# Patient Record
Sex: Female | Born: 1970
Health system: Southern US, Community
[De-identification: ages and names within clinical notes are randomized; demographics above are authoritative.]

## PROBLEM LIST (undated history)

## (undated) DIAGNOSIS — E559 Vitamin D deficiency, unspecified: Secondary | ICD-10-CM

## (undated) DIAGNOSIS — R768 Other specified abnormal immunological findings in serum: Secondary | ICD-10-CM

## (undated) DIAGNOSIS — D649 Anemia, unspecified: Secondary | ICD-10-CM

## (undated) DIAGNOSIS — K219 Gastro-esophageal reflux disease without esophagitis: Secondary | ICD-10-CM

## (undated) DIAGNOSIS — K4090 Unilateral inguinal hernia, without obstruction or gangrene, not specified as recurrent: Secondary | ICD-10-CM

## (undated) HISTORY — PX: LAMINECTOMY: SHX219

## (undated) HISTORY — PX: BACK SURGERY: SHX140

## (undated) HISTORY — PX: HERNIA REPAIR: SHX51

---

## 2007-02-14 ENCOUNTER — Emergency Department (HOSPITAL_COMMUNITY): Admission: EM | Admit: 2007-02-14 | Discharge: 2007-02-14 | Payer: Self-pay | Admitting: Emergency Medicine
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
## 2011-11-21 ENCOUNTER — Other Ambulatory Visit (HOSPITAL_COMMUNITY)
Admission: RE | Admit: 2011-11-21 | Discharge: 2011-11-21 | Disposition: A | Discharge status: Home / Self Care | Payer: BC Managed Care – PPO | Source: Ambulatory Visit | Attending: Family Medicine | Admitting: Family Medicine

## 2011-11-21 ENCOUNTER — Other Ambulatory Visit: Payer: Self-pay | Admitting: Family Medicine

## 2011-11-21 DIAGNOSIS — Z01419 Encounter for gynecological examination (general) (routine) without abnormal findings: Secondary | ICD-10-CM | POA: Insufficient documentation

## 2011-11-21 DIAGNOSIS — Z1231 Encounter for screening mammogram for malignant neoplasm of breast: Secondary | ICD-10-CM

## 2011-11-26 ENCOUNTER — Ambulatory Visit
Admission: RE | Admit: 2011-11-26 | Discharge: 2011-11-26 | Disposition: A | Discharge status: Home / Self Care | Payer: BC Managed Care – PPO | Source: Ambulatory Visit | Attending: Family Medicine | Admitting: Family Medicine

## 2011-11-26 DIAGNOSIS — Z1231 Encounter for screening mammogram for malignant neoplasm of breast: Secondary | ICD-10-CM

## 2011-11-28 ENCOUNTER — Other Ambulatory Visit: Payer: Self-pay | Admitting: Family Medicine

## 2011-11-28 DIAGNOSIS — R928 Other abnormal and inconclusive findings on diagnostic imaging of breast: Secondary | ICD-10-CM

## 2011-12-05 ENCOUNTER — Ambulatory Visit
Admission: RE | Admit: 2011-12-05 | Discharge: 2011-12-05 | Disposition: A | Discharge status: Home / Self Care | Payer: BC Managed Care – PPO | Source: Ambulatory Visit | Attending: Family Medicine | Admitting: Family Medicine

## 2011-12-05 DIAGNOSIS — R928 Other abnormal and inconclusive findings on diagnostic imaging of breast: Secondary | ICD-10-CM

## 2012-03-25 ENCOUNTER — Ambulatory Visit: Payer: BC Managed Care – PPO | Admitting: Gastroenterology

## 2012-08-10 ENCOUNTER — Other Ambulatory Visit (HOSPITAL_COMMUNITY): Payer: Self-pay | Admitting: Obstetrics and Gynecology

## 2012-08-10 DIAGNOSIS — N979 Female infertility, unspecified: Secondary | ICD-10-CM

## 2012-08-11 ENCOUNTER — Ambulatory Visit (HOSPITAL_COMMUNITY)
Admission: RE | Admit: 2012-08-11 | Discharge: 2012-08-11 | Disposition: A | Discharge status: Home / Self Care | Payer: 59 | Source: Ambulatory Visit | Attending: Obstetrics and Gynecology | Admitting: Obstetrics and Gynecology

## 2012-08-11 DIAGNOSIS — N979 Female infertility, unspecified: Secondary | ICD-10-CM | POA: Insufficient documentation

## 2012-08-11 MED ORDER — IOHEXOL 300 MG/ML  SOLN
10.0000 mL | Freq: Once | INTRAMUSCULAR | Status: AC | PRN
  Administered 2012-08-11: 10 mL

## 2012-10-05 ENCOUNTER — Other Ambulatory Visit: Payer: Self-pay | Admitting: Family Medicine

## 2012-10-05 DIAGNOSIS — R52 Pain, unspecified: Secondary | ICD-10-CM

## 2012-10-05 DIAGNOSIS — M79673 Pain in unspecified foot: Secondary | ICD-10-CM

## 2012-10-06 ENCOUNTER — Ambulatory Visit (HOSPITAL_COMMUNITY)
Admission: RE | Admit: 2012-10-06 | Discharge: 2012-10-06 | Disposition: A | Discharge status: Home / Self Care | Payer: 59 | Source: Ambulatory Visit | Attending: Family Medicine | Admitting: Family Medicine

## 2012-10-06 DIAGNOSIS — M79673 Pain in unspecified foot: Secondary | ICD-10-CM

## 2012-10-06 DIAGNOSIS — M79609 Pain in unspecified limb: Secondary | ICD-10-CM | POA: Insufficient documentation

## 2012-10-06 DIAGNOSIS — M65979 Unspecified synovitis and tenosynovitis, unspecified ankle and foot: Secondary | ICD-10-CM | POA: Insufficient documentation

## 2012-10-06 DIAGNOSIS — M19079 Primary osteoarthritis, unspecified ankle and foot: Secondary | ICD-10-CM | POA: Insufficient documentation

## 2012-10-06 DIAGNOSIS — M722 Plantar fascial fibromatosis: Secondary | ICD-10-CM | POA: Insufficient documentation

## 2012-10-06 DIAGNOSIS — R52 Pain, unspecified: Secondary | ICD-10-CM

## 2012-10-06 DIAGNOSIS — M659 Synovitis and tenosynovitis, unspecified: Secondary | ICD-10-CM | POA: Insufficient documentation

## 2012-10-06 DIAGNOSIS — R6889 Other general symptoms and signs: Secondary | ICD-10-CM | POA: Insufficient documentation

## 2012-11-17 ENCOUNTER — Other Ambulatory Visit: Payer: Self-pay | Admitting: Family Medicine

## 2012-11-17 DIAGNOSIS — Z1231 Encounter for screening mammogram for malignant neoplasm of breast: Secondary | ICD-10-CM

## 2012-12-09 ENCOUNTER — Other Ambulatory Visit: Payer: Self-pay | Admitting: Family Medicine

## 2012-12-09 ENCOUNTER — Ambulatory Visit
Admission: RE | Admit: 2012-12-09 | Discharge: 2012-12-09 | Disposition: A | Discharge status: Home / Self Care | Payer: BC Managed Care – PPO | Source: Ambulatory Visit | Attending: Family Medicine | Admitting: Family Medicine

## 2012-12-09 DIAGNOSIS — Z1231 Encounter for screening mammogram for malignant neoplasm of breast: Secondary | ICD-10-CM

## 2012-12-09 DIAGNOSIS — R928 Other abnormal and inconclusive findings on diagnostic imaging of breast: Secondary | ICD-10-CM

## 2012-12-23 ENCOUNTER — Ambulatory Visit
Admission: RE | Admit: 2012-12-23 | Discharge: 2012-12-23 | Disposition: A | Discharge status: Home / Self Care | Payer: BC Managed Care – PPO | Source: Ambulatory Visit | Attending: Family Medicine | Admitting: Family Medicine

## 2012-12-23 ENCOUNTER — Other Ambulatory Visit: Payer: Self-pay | Admitting: Family Medicine

## 2012-12-23 DIAGNOSIS — R928 Other abnormal and inconclusive findings on diagnostic imaging of breast: Secondary | ICD-10-CM

## 2013-02-28 ENCOUNTER — Other Ambulatory Visit (HOSPITAL_COMMUNITY): Payer: Self-pay | Admitting: Podiatry

## 2013-02-28 DIAGNOSIS — M79672 Pain in left foot: Secondary | ICD-10-CM

## 2013-02-28 DIAGNOSIS — M722 Plantar fascial fibromatosis: Secondary | ICD-10-CM

## 2013-06-07 ENCOUNTER — Ambulatory Visit: Payer: BC Managed Care – PPO | Attending: Family Medicine

## 2013-12-19 ENCOUNTER — Other Ambulatory Visit: Payer: Self-pay

## 2013-12-19 DIAGNOSIS — Z1231 Encounter for screening mammogram for malignant neoplasm of breast: Secondary | ICD-10-CM

## 2014-01-04 ENCOUNTER — Ambulatory Visit
Admission: RE | Admit: 2014-01-04 | Discharge: 2014-01-04 | Disposition: A | Discharge status: Home / Self Care | Payer: No Typology Code available for payment source | Source: Ambulatory Visit

## 2014-01-04 DIAGNOSIS — Z1231 Encounter for screening mammogram for malignant neoplasm of breast: Secondary | ICD-10-CM

## 2014-03-06 ENCOUNTER — Ambulatory Visit: Payer: Self-pay | Admitting: Medical

## 2014-04-15 ENCOUNTER — Emergency Department (INDEPENDENT_AMBULATORY_CARE_PROVIDER_SITE_OTHER): Payer: Worker's Compensation

## 2014-04-15 ENCOUNTER — Encounter (HOSPITAL_COMMUNITY): Payer: Self-pay | Admitting: Emergency Medicine

## 2014-04-15 ENCOUNTER — Emergency Department (INDEPENDENT_AMBULATORY_CARE_PROVIDER_SITE_OTHER)
Admission: EM | Admit: 2014-04-15 | Discharge: 2014-04-15 | Disposition: A | Discharge status: Home / Self Care | Payer: Worker's Compensation | Source: Home / Self Care | Attending: Emergency Medicine | Admitting: Emergency Medicine

## 2014-04-15 DIAGNOSIS — S60221A Contusion of right hand, initial encounter: Secondary | ICD-10-CM

## 2014-04-15 DIAGNOSIS — S60229A Contusion of unspecified hand, initial encounter: Secondary | ICD-10-CM

## 2014-04-15 DIAGNOSIS — Y9229 Other specified public building as the place of occurrence of the external cause: Secondary | ICD-10-CM

## 2014-04-15 DIAGNOSIS — W230XXA Caught, crushed, jammed, or pinched between moving objects, initial encounter: Secondary | ICD-10-CM

## 2014-04-15 NOTE — Discharge Instructions (Signed)

## 2014-04-15 NOTE — ED Provider Notes (Signed)
  Chief Complaint   Chief Complaint  Patient presents with  . Hand Pain    History of Present Illness   Kristin Conner is a 43 year old female who closed her right hand in the foldable folding door around 6:15 PM tonight at work at [employer]. She has some bruises over the mid phalanx of the second, third, and fourth fingers. She's able to fully extend and flex the fingers and there is no numbness or tingling. This is a workers comp case.  Review of Systems   Other than as noted above, the patient denies any of the following symptoms: Systemic:  No fevers or chills. Musculoskeletal:  No joint pain or arthritis.  Neurological:  No muscular weakness or paresthesias.  [place]   Past medical history, family history, social history, meds, and allergies were reviewed.     Physical Examination   Vital signs:  BP 113/76  Temp(Src) 98.7 F (37.1 C) (Oral)  Resp 16  SpO2 100%  LMP 03/25/2014 Gen:  Alert and oriented times 3.  In no distress. Musculoskeletal:  Exam of the hand reveals bruising and pain to palpation over the mid phalanges of the second, third, and fourth fingers dorsally. She has full extension full flexion.  Otherwise, all joints had a full a ROM with no swelling, bruising or deformity.  No edema, pulses full. Extremities were warm and pink.  Capillary refill was brisk.  Skin:  Clear, warm and dry.  No rash. Neuro:  Alert and oriented times 3.  Muscle strength was normal.  Sensation was intact to light touch.   Radiology   Dg Hand Complete Right  04/15/2014   CLINICAL DATA:  Hand slammed in a door. Pain in the 2nd to 4th digits.  EXAM: RIGHT HAND - COMPLETE 3+ VIEW  COMPARISON:  Mild soft tissue swelling is present.  None available.  FINDINGS: Mild soft tissue swelling is present. No acute osseous abnormality is present. No radiopaque foreign body is evident.  IMPRESSION: Soft tissue swelling without acute osseous abnormality.   Electronically Signed   By: Lawrence Santiago M.D.   On: 04/15/2014 19:44   I reviewed the images independently and personally and concur with the radiologist's findings.  Assessment   The encounter diagnosis was Contusion of right hand.  Advised rest, ice, elevation.  Plan   1.  Meds:  The following meds were prescribed:  There are no discharge medications for this patient.   2.  Patient Education/Counseling:  The patient was given appropriate handouts, self care instructions, and instructed in symptomatic relief, including rest and activity, and elevation.   3.  Follow up:  The patient was told to follow up here if no better in 3 to 4 days, or sooner if becoming worse in any way, and given some red flag symptoms such as worsening pain, fever, swelling, or neurological symptoms which would prompt immediate return.  Followup with occupational health clinic if no improvement by next week.      Harden Mo, MD 04/15/14 2138

## 2014-04-15 NOTE — ED Notes (Signed)
Patient states she had gotten her right hand caught in a retractable Door injuring  Three fingers on her right hand

## 2014-12-11 ENCOUNTER — Other Ambulatory Visit: Payer: Self-pay

## 2014-12-11 DIAGNOSIS — Z1231 Encounter for screening mammogram for malignant neoplasm of breast: Secondary | ICD-10-CM

## 2015-01-08 ENCOUNTER — Ambulatory Visit
Admission: RE | Admit: 2015-01-08 | Discharge: 2015-01-08 | Disposition: A | Discharge status: Home / Self Care | Payer: BLUE CROSS/BLUE SHIELD | Source: Ambulatory Visit

## 2015-01-08 DIAGNOSIS — Z1231 Encounter for screening mammogram for malignant neoplasm of breast: Secondary | ICD-10-CM

## 2015-11-16 ENCOUNTER — Other Ambulatory Visit: Payer: Self-pay | Admitting: Family Medicine

## 2015-11-16 DIAGNOSIS — N920 Excessive and frequent menstruation with regular cycle: Secondary | ICD-10-CM

## 2015-11-16 DIAGNOSIS — Z1231 Encounter for screening mammogram for malignant neoplasm of breast: Secondary | ICD-10-CM

## 2015-11-27 ENCOUNTER — Ambulatory Visit
Admission: RE | Admit: 2015-11-27 | Discharge: 2015-11-27 | Disposition: A | Discharge status: Home / Self Care | Payer: BLUE CROSS/BLUE SHIELD | Source: Ambulatory Visit | Attending: Family Medicine | Admitting: Family Medicine

## 2015-11-27 DIAGNOSIS — N920 Excessive and frequent menstruation with regular cycle: Secondary | ICD-10-CM

## 2016-01-09 ENCOUNTER — Ambulatory Visit
Admission: RE | Admit: 2016-01-09 | Discharge: 2016-01-09 | Disposition: A | Discharge status: Home / Self Care | Payer: BLUE CROSS/BLUE SHIELD | Source: Ambulatory Visit | Attending: Family Medicine | Admitting: Family Medicine

## 2016-01-09 DIAGNOSIS — Z1231 Encounter for screening mammogram for malignant neoplasm of breast: Secondary | ICD-10-CM

## 2016-01-24 ENCOUNTER — Other Ambulatory Visit: Payer: Self-pay | Admitting: Obstetrics and Gynecology

## 2016-01-24 NOTE — Patient Instructions (Signed)
Your procedure is scheduled on:  Friday, February 08, 2016  Enter through the [employer] of [facility] at:  11:15 AM  Pick up the phone at the desk and dial [phone].  Call this number if you have problems the morning of surgery: [phone].  Remember:  Do NOT eat food or drink after: Midnight Thursday, February 07, 2016  Take these medicines the morning of surgery with a SIP OF WATER:  None  Do NOT wear jewelry (body piercing), metal hair clips/bobby pins, make-up, or nail polish. Do NOT wear lotions, powders, or perfumes.  You may wear deodorant. Do NOT shave for 48 hours prior to surgery. Do NOT bring valuables to the hospital. Contacts, dentures, or bridgework may not be worn into surgery.[place] suitcase in car.  After surgery it may be brought to your room.  For patients admitted to the hospital, checkout time is 11:00 AM the day of discharge.

## 2016-01-25 ENCOUNTER — Encounter (HOSPITAL_COMMUNITY)
Admission: RE | Admit: 2016-01-25 | Discharge: 2016-01-25 | Disposition: A | Discharge status: Home / Self Care | Payer: BLUE CROSS/BLUE SHIELD | Source: Ambulatory Visit | Attending: Obstetrics and Gynecology | Admitting: Obstetrics and Gynecology

## 2016-01-25 ENCOUNTER — Encounter (HOSPITAL_COMMUNITY): Payer: Self-pay

## 2016-01-25 DIAGNOSIS — Z01812 Encounter for preprocedural laboratory examination: Secondary | ICD-10-CM | POA: Insufficient documentation

## 2016-01-25 HISTORY — DX: Unilateral inguinal hernia, without obstruction or gangrene, not specified as recurrent: K40.90

## 2016-01-25 HISTORY — DX: Other specified abnormal immunological findings in serum: R76.8

## 2016-01-25 HISTORY — DX: Anemia, unspecified: D64.9

## 2016-01-25 HISTORY — DX: Vitamin D deficiency, unspecified: E55.9

## 2016-01-25 HISTORY — DX: Gastro-esophageal reflux disease without esophagitis: K21.9

## 2016-01-25 LAB — CBC
HCT: 38.4 % (ref 36.0–46.0)
Hemoglobin: 13.4 g/dL (ref 12.0–15.0)
MCH: 29.6 pg (ref 26.0–34.0)
MCHC: 34.9 g/dL (ref 30.0–36.0)
MCV: 85 fL (ref 78.0–100.0)
PLATELETS: 211 10*3/uL (ref 150–400)
RBC: 4.52 MIL/uL (ref 3.87–5.11)
RDW: 19.3 % — ABNORMAL HIGH (ref 11.5–15.5)
WBC: 7.6 10*3/uL (ref 4.0–10.5)

## 2016-01-25 LAB — TYPE AND SCREEN
ABO/RH(D): O POS
Antibody Screen: NEGATIVE

## 2016-01-25 LAB — BASIC METABOLIC PANEL
Anion gap: 6 (ref 5–15)
BUN: 15 mg/dL (ref 6–20)
CO2: 27 mmol/L (ref 22–32)
CREATININE: 0.69 mg/dL (ref 0.44–1.00)
Calcium: 8.8 mg/dL — ABNORMAL LOW (ref 8.9–10.3)
Chloride: 107 mmol/L (ref 101–111)
GFR calc Af Amer: >60 mL/min (ref >60)
Glucose, Bld: 107 mg/dL — ABNORMAL HIGH (ref 65–99)
POTASSIUM: 4.8 mmol/L (ref 3.5–5.1)
SODIUM: 140 mmol/L (ref 135–145)

## 2016-01-25 LAB — ABO/RH: ABO/RH(D): O POS

## 2016-02-07 NOTE — H&P (Signed)
Kristin Conner, Kristin Conner            ACCOUNT NO.:  [id number]  MEDICAL RECORD NO.:  [id number]  LOCATION:  SDC                           FACILITY:  [facility]  PHYSICIAN:  Lovenia Kim, M.D.DATE OF BIRTH:  [birth year]  DATE OF ADMISSION:  02/08/2016 DATE OF DISCHARGE:  02/08/2016                             HISTORY & PHYSICAL   CHIEF COMPLAINT:  Symptomatic fibroids with pelvic pain, dysmenorrhea, and menorrhagia.  HISTORY OF PRESENT ILLNESS:  She is a 45 year old white female, G3, P0, who presents with aforementioned symptomatology for definitive therapy.  ALLERGIES:  She has no known drug allergies.  MEDICATIONS:  Omeprazole, ibuprofen as needed, melatonin, and ferrous sulfate.  PAST HISTORY:  She has a past history of ovarian cysts, anemia, history of hepatitis, and dysmenorrhea. Noncontributory.  SOCIAL HISTORY:  She is a nonsmoker, nondrinker.  She denies domestic or physical violence.  PHYSICAL EXAMINATION:  GENERAL:  She is a well-developed, well- nourished, white female, in no acute distress. HEENT:  Normal. NECK:  Supple.  Full range of motion. LUNGS:  Clear to auscultation. ABDOMEN:  Soft, nontender. PELVIC:  Exam reveals 12 weeks size, irregular uterus and no adnexal masses. EXTREMITIES:  There are no cords. NEUROLOGIC:  Nonfocal. SKIN:  Intact.  IMPRESSION:  Symptomatic uterine fibroids with dysmenorrhea, menorrhagia, for definitive therapy. History of negative endometrial biopsy on 03/17.  PLAN:  To proceed with a Vinci assisted total laparoscopic hysterectomy, bilateral salpingectomy.  Risks of anesthesia, infection, bleeding, injury to surrounding organs with possible need for repair were discussed.  Delayed versus immediate complications to include bowel and bladder injury are noted.  The patient acknowledges and wishes to proceed.     Lovenia Kim, M.D.     RJT/MEDQ  D:  02/07/2016  T:  02/07/2016  Job:  [id number]

## 2016-02-07 NOTE — H&P (Deleted)
Kristin Conner, Kristin Conner            ACCOUNT NO.:  [id number]  MEDICAL RECORD NO.:  [id number]  LOCATION:  SDC                           FACILITY:  [facility]  PHYSICIAN:  Lovenia Kim, M.D.DATE OF BIRTH:  [birth year]  DATE OF ADMISSION:  02/08/2016 DATE OF DISCHARGE:  02/08/2016                             HISTORY & PHYSICAL   CHIEF COMPLAINT:  Symptomatic fibroids with pelvic pain, dysmenorrhea, and menorrhagia.  HISTORY OF PRESENT ILLNESS:  She is a 45 year old white female, G3, P0, who presents with aforementioned symptomatology for definitive therapy.  ALLERGIES:  She has no known drug allergies.  MEDICATIONS:  Omeprazole, ibuprofen as needed, melatonin, and ferrous sulfate.  PAST HISTORY:  She has a past history of ovarian cysts, anemia, history of hepatitis, and dysmenorrhea. Noncontributory.  SOCIAL HISTORY:  She is a nonsmoker, nondrinker.  She denies domestic or physical violence.  PHYSICAL EXAMINATION:  GENERAL:  She is a well-developed, well- nourished, white female, in no acute distress. HEENT:  Normal. NECK:  Supple.  Full range of motion. LUNGS:  Clear to auscultation. ABDOMEN:  Soft, nontender. PELVIC:  Exam reveals 12 weeks size, irregular uterus and no adnexal masses. EXTREMITIES:  There are no cords. NEUROLOGIC:  Nonfocal. SKIN:  Intact.  IMPRESSION:  Symptomatic uterine fibroids with dysmenorrhea, menorrhagia, for definitive therapy. History of negative endometrial biopsy on 03/17.  PLAN:  To proceed with a Vinci assisted total laparoscopic hysterectomy, bilateral salpingectomy.  Risks of anesthesia, infection, bleeding, injury to surrounding organs with possible need for repair were discussed.  Delayed versus immediate complications to include bowel and bladder injury are noted.  The patient acknowledges and wishes to proceed.     Lovenia Kim, M.D.     RJT/MEDQ  D:  02/07/2016  T:  02/07/2016  Job:  [id number]

## 2016-02-08 ENCOUNTER — Ambulatory Visit (HOSPITAL_COMMUNITY)
Admission: RE | Admit: 2016-02-08 | Discharge: 2016-02-09 | Disposition: A | Discharge status: Home / Self Care | Payer: BLUE CROSS/BLUE SHIELD | Source: Ambulatory Visit | Attending: Obstetrics and Gynecology | Admitting: Obstetrics and Gynecology

## 2016-02-08 ENCOUNTER — Ambulatory Visit (HOSPITAL_COMMUNITY): Payer: BLUE CROSS/BLUE SHIELD | Admitting: Anesthesiology

## 2016-02-08 ENCOUNTER — Encounter (HOSPITAL_COMMUNITY)
Admission: RE | Disposition: A | Discharge status: Home / Self Care | Payer: Self-pay | Source: Ambulatory Visit | Attending: Obstetrics and Gynecology

## 2016-02-08 ENCOUNTER — Encounter (HOSPITAL_COMMUNITY): Payer: Self-pay | Admitting: Emergency Medicine

## 2016-02-08 DIAGNOSIS — N739 Female pelvic inflammatory disease, unspecified: Secondary | ICD-10-CM | POA: Insufficient documentation

## 2016-02-08 DIAGNOSIS — N971 Female infertility of tubal origin: Secondary | ICD-10-CM | POA: Insufficient documentation

## 2016-02-08 DIAGNOSIS — R102 Pelvic and perineal pain: Secondary | ICD-10-CM | POA: Diagnosis not present

## 2016-02-08 DIAGNOSIS — D259 Leiomyoma of uterus, unspecified: Secondary | ICD-10-CM | POA: Diagnosis present

## 2016-02-08 DIAGNOSIS — N840 Polyp of corpus uteri: Secondary | ICD-10-CM | POA: Insufficient documentation

## 2016-02-08 DIAGNOSIS — B1920 Unspecified viral hepatitis C without hepatic coma: Secondary | ICD-10-CM | POA: Insufficient documentation

## 2016-02-08 DIAGNOSIS — N920 Excessive and frequent menstruation with regular cycle: Secondary | ICD-10-CM | POA: Insufficient documentation

## 2016-02-08 DIAGNOSIS — D250 Submucous leiomyoma of uterus: Secondary | ICD-10-CM | POA: Diagnosis present

## 2016-02-08 DIAGNOSIS — N802 Endometriosis of fallopian tube: Secondary | ICD-10-CM | POA: Diagnosis not present

## 2016-02-08 DIAGNOSIS — N72 Inflammatory disease of cervix uteri: Secondary | ICD-10-CM | POA: Diagnosis not present

## 2016-02-08 DIAGNOSIS — N800 Endometriosis of uterus: Secondary | ICD-10-CM | POA: Diagnosis not present

## 2016-02-08 DIAGNOSIS — N736 Female pelvic peritoneal adhesions (postinfective): Secondary | ICD-10-CM | POA: Insufficient documentation

## 2016-02-08 DIAGNOSIS — K219 Gastro-esophageal reflux disease without esophagitis: Secondary | ICD-10-CM | POA: Insufficient documentation

## 2016-02-08 DIAGNOSIS — D251 Intramural leiomyoma of uterus: Secondary | ICD-10-CM | POA: Insufficient documentation

## 2016-02-08 DIAGNOSIS — N946 Dysmenorrhea, unspecified: Secondary | ICD-10-CM | POA: Diagnosis not present

## 2016-02-08 HISTORY — PX: ROBOTIC ASSISTED TOTAL HYSTERECTOMY WITH SALPINGECTOMY: SHX6679

## 2016-02-08 LAB — HCG, SERUM, QUALITATIVE: PREG SERUM: NEGATIVE

## 2016-02-08 SURGERY — ROBOTIC ASSISTED TOTAL HYSTERECTOMY WITH SALPINGECTOMY
Anesthesia: General | Laterality: Bilateral

## 2016-02-08 MED ORDER — ROPIVACAINE HCL 5 MG/ML IJ SOLN
INTRAMUSCULAR | Status: AC
  Filled 2016-02-08: qty 30

## 2016-02-08 MED ORDER — PHENYLEPHRINE HCL 10 MG/ML IJ SOLN
INTRAMUSCULAR | Status: DC | PRN
  Administered 2016-02-08 (×7): 40 ug via INTRAVENOUS

## 2016-02-08 MED ORDER — MIDAZOLAM HCL 2 MG/2ML IJ SOLN
INTRAMUSCULAR | Status: AC
  Filled 2016-02-08: qty 2

## 2016-02-08 MED ORDER — LIDOCAINE HCL (CARDIAC) 20 MG/ML IV SOLN
INTRAVENOUS | Status: AC
  Filled 2016-02-08: qty 5

## 2016-02-08 MED ORDER — SODIUM CHLORIDE 0.9% FLUSH
9.0000 mL | INTRAVENOUS | Status: DC | PRN
Start: 2016-02-08 — End: 2016-02-09

## 2016-02-08 MED ORDER — LACTATED RINGERS IV SOLN
INTRAVENOUS | Status: DC
  Administered 2016-02-08: 125 mL/h via INTRAVENOUS
  Administered 2016-02-08 (×2): via INTRAVENOUS

## 2016-02-08 MED ORDER — ARTIFICIAL TEARS OP OINT
TOPICAL_OINTMENT | OPHTHALMIC | Status: AC
  Filled 2016-02-08: qty 3.5

## 2016-02-08 MED ORDER — GLYCOPYRROLATE 0.2 MG/ML IJ SOLN
INTRAMUSCULAR | Status: DC | PRN
  Administered 2016-02-08: 0.2 mg via INTRAVENOUS
  Administered 2016-02-08: 0.6 mg via INTRAVENOUS

## 2016-02-08 MED ORDER — SCOPOLAMINE 1 MG/3DAYS TD PT72
1.0000 | MEDICATED_PATCH | Freq: Once | TRANSDERMAL | Status: DC
Start: 2016-02-08 — End: 2016-02-08
  Administered 2016-02-08: 1.5 mg via TRANSDERMAL

## 2016-02-08 MED ORDER — DIPHENHYDRAMINE HCL 12.5 MG/5ML PO ELIX: 12.5000 mg | ORAL_SOLUTION | Freq: Four times a day (QID) | ORAL | Status: DC | PRN

## 2016-02-08 MED ORDER — SODIUM CHLORIDE 0.9 % IJ SOLN
INTRAMUSCULAR | Status: AC
Start: 2016-02-08 — End: 2016-02-08
  Filled 2016-02-08: qty 20

## 2016-02-08 MED ORDER — PROPOFOL 10 MG/ML IV BOLUS
INTRAVENOUS | Status: AC
  Filled 2016-02-08: qty 20

## 2016-02-08 MED ORDER — ONDANSETRON HCL 4 MG/2ML IJ SOLN
INTRAMUSCULAR | Status: DC | PRN
  Administered 2016-02-08: 4 mg via INTRAVENOUS

## 2016-02-08 MED ORDER — HYDROMORPHONE HCL 1 MG/ML IJ SOLN
INTRAMUSCULAR | Status: DC | PRN
  Administered 2016-02-08 (×2): 0.5 mg via INTRAVENOUS

## 2016-02-08 MED ORDER — BUPIVACAINE LIPOSOME 1.3 % IJ SUSP
20.0000 mL | Freq: Once | INTRAMUSCULAR | Status: AC
  Administered 2016-02-08: 20 mL
  Filled 2016-02-08: qty 20

## 2016-02-08 MED ORDER — KETOROLAC TROMETHAMINE 30 MG/ML IJ SOLN: 30.0000 mg | Freq: Once | INTRAMUSCULAR | Status: DC

## 2016-02-08 MED ORDER — HYDROMORPHONE 1 MG/ML IV SOLN
INTRAVENOUS | Status: DC
  Administered 2016-02-08: 3.2 mg via INTRAVENOUS
  Administered 2016-02-08: 18:00:00 via INTRAVENOUS
  Administered 2016-02-09: 0.8 mg via INTRAVENOUS
  Administered 2016-02-09: 2.8 mg via INTRAVENOUS
  Administered 2016-02-09: 0.6 mg via INTRAVENOUS
  Filled 2016-02-08: qty 25

## 2016-02-08 MED ORDER — TRAMADOL HCL 50 MG PO TABS: 50.0000 mg | ORAL_TABLET | Freq: Four times a day (QID) | ORAL | Status: DC | PRN

## 2016-02-08 MED ORDER — DEXAMETHASONE SODIUM PHOSPHATE 4 MG/ML IJ SOLN
INTRAMUSCULAR | Status: AC
  Filled 2016-02-08: qty 1

## 2016-02-08 MED ORDER — DEXAMETHASONE SODIUM PHOSPHATE 10 MG/ML IJ SOLN
INTRAMUSCULAR | Status: DC | PRN
  Administered 2016-02-08: 4 mg via INTRAVENOUS

## 2016-02-08 MED ORDER — NEOSTIGMINE METHYLSULFATE 10 MG/10ML IV SOLN
INTRAVENOUS | Status: DC | PRN
  Administered 2016-02-08: 3 mg via INTRAVENOUS

## 2016-02-08 MED ORDER — KETOROLAC TROMETHAMINE 30 MG/ML IJ SOLN
INTRAMUSCULAR | Status: AC
  Filled 2016-02-08: qty 1

## 2016-02-08 MED ORDER — DEXAMETHASONE SODIUM PHOSPHATE 10 MG/ML IJ SOLN: INTRAMUSCULAR | Status: DC | PRN

## 2016-02-08 MED ORDER — PROCHLORPERAZINE EDISYLATE 5 MG/ML IJ SOLN: 10.0000 mg | Freq: Once | INTRAMUSCULAR | Status: DC | PRN

## 2016-02-08 MED ORDER — NEOSTIGMINE METHYLSULFATE 10 MG/10ML IV SOLN
INTRAVENOUS | Status: AC
  Filled 2016-02-08: qty 1

## 2016-02-08 MED ORDER — FENTANYL CITRATE (PF) 100 MCG/2ML IJ SOLN
INTRAMUSCULAR | Status: DC | PRN
  Administered 2016-02-08 (×4): 50 ug via INTRAVENOUS
  Administered 2016-02-08: 100 ug via INTRAVENOUS
  Administered 2016-02-08: 50 ug via INTRAVENOUS

## 2016-02-08 MED ORDER — DIPHENHYDRAMINE HCL 50 MG/ML IJ SOLN: 12.5000 mg | Freq: Four times a day (QID) | INTRAMUSCULAR | Status: DC | PRN

## 2016-02-08 MED ORDER — FENTANYL CITRATE (PF) 100 MCG/2ML IJ SOLN
INTRAMUSCULAR | Status: AC
  Filled 2016-02-08: qty 2

## 2016-02-08 MED ORDER — SODIUM CHLORIDE 0.9 % IJ SOLN
INTRAMUSCULAR | Status: AC
  Filled 2016-02-08: qty 50

## 2016-02-08 MED ORDER — ROCURONIUM BROMIDE 100 MG/10ML IV SOLN
INTRAVENOUS | Status: AC
Start: 2016-02-08 — End: 2016-02-08
  Filled 2016-02-08: qty 1

## 2016-02-08 MED ORDER — METOCLOPRAMIDE HCL 5 MG/ML IJ SOLN
10.0000 mg | Freq: Once | INTRAMUSCULAR | Status: AC | PRN
  Administered 2016-02-08: 10 mg via INTRAVENOUS

## 2016-02-08 MED ORDER — OXYCODONE-ACETAMINOPHEN 5-325 MG PO TABS: 1.0000 | ORAL_TABLET | ORAL | Status: DC | PRN

## 2016-02-08 MED ORDER — HYDROCODONE-ACETAMINOPHEN 7.5-325 MG PO TABS: 1.0000 | ORAL_TABLET | Freq: Once | ORAL | Status: DC | PRN

## 2016-02-08 MED ORDER — FENTANYL CITRATE (PF) 100 MCG/2ML IJ SOLN: 25.0000 ug | INTRAMUSCULAR | Status: DC | PRN

## 2016-02-08 MED ORDER — NALOXONE HCL 0.4 MG/ML IJ SOLN: 0.4000 mg | INTRAMUSCULAR | Status: DC | PRN

## 2016-02-08 MED ORDER — PROPOFOL 10 MG/ML IV BOLUS
INTRAVENOUS | Status: DC | PRN
  Administered 2016-02-08: 150 mg via INTRAVENOUS
  Administered 2016-02-08: 30 mg via INTRAVENOUS
  Administered 2016-02-08: 20 mg via INTRAVENOUS

## 2016-02-08 MED ORDER — SCOPOLAMINE 1 MG/3DAYS TD PT72
MEDICATED_PATCH | TRANSDERMAL | Status: AC
  Administered 2016-02-08: 1.5 mg via TRANSDERMAL
  Filled 2016-02-08: qty 1

## 2016-02-08 MED ORDER — METOCLOPRAMIDE HCL 5 MG/ML IJ SOLN
INTRAMUSCULAR | Status: AC
  Filled 2016-02-08: qty 2

## 2016-02-08 MED ORDER — HYDROMORPHONE HCL 1 MG/ML IJ SOLN
INTRAMUSCULAR | Status: AC
  Filled 2016-02-08: qty 1

## 2016-02-08 MED ORDER — MIDAZOLAM HCL 2 MG/2ML IJ SOLN
INTRAMUSCULAR | Status: DC | PRN
  Administered 2016-02-08: 2 mg via INTRAVENOUS

## 2016-02-08 MED ORDER — DEXTROSE IN LACTATED RINGERS 5 % IV SOLN
INTRAVENOUS | Status: DC
  Administered 2016-02-08 – 2016-02-09 (×2): via INTRAVENOUS

## 2016-02-08 MED ORDER — SODIUM CHLORIDE 0.9 % IV SOLN
INTRAVENOUS | Status: DC | PRN
  Administered 2016-02-08: 120 mL

## 2016-02-08 MED ORDER — CEFAZOLIN SODIUM-DEXTROSE 2-4 GM/100ML-% IV SOLN
2.0000 g | INTRAVENOUS | Status: DC
  Filled 2016-02-08: qty 100

## 2016-02-08 MED ORDER — BUPIVACAINE HCL (PF) 0.25 % IJ SOLN
INTRAMUSCULAR | Status: DC | PRN
  Administered 2016-02-08: 30 mL

## 2016-02-08 MED ORDER — BUPIVACAINE HCL (PF) 0.25 % IJ SOLN
INTRAMUSCULAR | Status: AC
  Filled 2016-02-08: qty 30

## 2016-02-08 MED ORDER — CEFAZOLIN SODIUM-DEXTROSE 2-3 GM-% IV SOLR
INTRAVENOUS | Status: AC
  Administered 2016-02-08: 2 g via INTRAVENOUS
  Filled 2016-02-08: qty 50

## 2016-02-08 MED ORDER — LIDOCAINE HCL (CARDIAC) 20 MG/ML IV SOLN
INTRAVENOUS | Status: DC | PRN
  Administered 2016-02-08: 30 mg via INTRAVENOUS

## 2016-02-08 MED ORDER — GLYCOPYRROLATE 0.2 MG/ML IJ SOLN
INTRAMUSCULAR | Status: AC
  Filled 2016-02-08: qty 5

## 2016-02-08 MED ORDER — ONDANSETRON HCL 4 MG/2ML IJ SOLN: 4.0000 mg | Freq: Four times a day (QID) | INTRAMUSCULAR | Status: DC | PRN

## 2016-02-08 MED ORDER — ONDANSETRON HCL 4 MG/2ML IJ SOLN
INTRAMUSCULAR | Status: AC
  Filled 2016-02-08: qty 2

## 2016-02-08 MED ORDER — ROCURONIUM BROMIDE 100 MG/10ML IV SOLN
INTRAVENOUS | Status: DC | PRN
  Administered 2016-02-08: 35 mg via INTRAVENOUS
  Administered 2016-02-08: 5 mg via INTRAVENOUS
  Administered 2016-02-08: 20 mg via INTRAVENOUS
  Administered 2016-02-08: 15 mg via INTRAVENOUS
  Administered 2016-02-08 (×2): 10 mg via INTRAVENOUS

## 2016-02-08 MED ORDER — FENTANYL CITRATE (PF) 250 MCG/5ML IJ SOLN
INTRAMUSCULAR | Status: AC
  Filled 2016-02-08: qty 5

## 2016-02-08 MED ORDER — PHENYLEPHRINE 40 MCG/ML (10ML) SYRINGE FOR IV PUSH (FOR BLOOD PRESSURE SUPPORT)
PREFILLED_SYRINGE | INTRAVENOUS | Status: AC
  Filled 2016-02-08: qty 20

## 2016-02-08 SURGICAL SUPPLY — 51 items
BARRIER ADHS 3X4 INTERCEED (GAUZE/BANDAGES/DRESSINGS) IMPLANT
CATH FOLEY 3WAY  5CC 16FR (CATHETERS) ×2
CATH FOLEY 3WAY 5CC 16FR (CATHETERS) ×1 IMPLANT
CELL SAVER LIPIGURD (MISCELLANEOUS) IMPLANT
CLOTH BEACON ORANGE TIMEOUT ST (SAFETY) ×3 IMPLANT
CONT PATH 16OZ SNAP LID 3702 (MISCELLANEOUS) ×3 IMPLANT
COVER BACK TABLE 60X90IN (DRAPES) ×6 IMPLANT
COVER TIP SHEARS 8 DVNC (MISCELLANEOUS) ×1 IMPLANT
COVER TIP SHEARS 8MM DA VINCI (MISCELLANEOUS) ×2
DECANTER SPIKE VIAL GLASS SM (MISCELLANEOUS) ×12 IMPLANT
DRSG OPSITE POSTOP 3X4 (GAUZE/BANDAGES/DRESSINGS) ×3 IMPLANT
DURAPREP 26ML APPLICATOR (WOUND CARE) ×3 IMPLANT
ELECT REM PT RETURN 9FT ADLT (ELECTROSURGICAL) ×3
ELECTRODE REM PT RTRN 9FT ADLT (ELECTROSURGICAL) ×1 IMPLANT
EXTRT SYSTEM ALEXIS 14CM (MISCELLANEOUS)
GAUZE VASELINE 3X9 (GAUZE/BANDAGES/DRESSINGS) IMPLANT
GLOVE BIO SURGEON STRL SZ7.5 (GLOVE) ×6 IMPLANT
GLOVE BIOGEL PI IND STRL 7.0 (GLOVE) ×3 IMPLANT
GLOVE BIOGEL PI INDICATOR 7.0 (GLOVE) ×6
KIT ACCESSORY DA VINCI DISP (KITS) ×2
KIT ACCESSORY DVNC DISP (KITS) ×1 IMPLANT
LEGGING LITHOTOMY PAIR STRL (DRAPES) ×3 IMPLANT
LIQUID BAND (GAUZE/BANDAGES/DRESSINGS) ×3 IMPLANT
NEEDLE INSUFFLATION 150MM (ENDOMECHANICALS) ×3 IMPLANT
OCCLUDER COLPOPNEUMO (BALLOONS) IMPLANT
PACK ROBOT WH (CUSTOM PROCEDURE TRAY) ×3 IMPLANT
PACK ROBOTIC GOWN (GOWN DISPOSABLE) ×3 IMPLANT
PAD PREP 24X48 CUFFED NSTRL (MISCELLANEOUS) ×3 IMPLANT
PAD TRENDELENBURG POSITION (MISCELLANEOUS) ×3 IMPLANT
SET CYSTO W/LG BORE CLAMP LF (SET/KITS/TRAYS/PACK) IMPLANT
SET IRRIG TUBING LAPAROSCOPIC (IRRIGATION / IRRIGATOR) ×3 IMPLANT
SET TRI-LUMEN FLTR TB AIRSEAL (TUBING) ×3 IMPLANT
SUT VIC AB 0 CT1 27 (SUTURE) ×4
SUT VIC AB 0 CT1 27XBRD ANBCTR (SUTURE) ×2 IMPLANT
SUT VICRYL 0 UR6 27IN ABS (SUTURE) ×3 IMPLANT
SUT VICRYL RAPIDE 4/0 PS 2 (SUTURE) ×6 IMPLANT
SUT VLOC 180 0 9IN  GS21 (SUTURE) ×2
SUT VLOC 180 0 9IN GS21 (SUTURE) ×1 IMPLANT
SYR 50ML LL SCALE MARK (SYRINGE) ×3 IMPLANT
SYRINGE 10CC LL (SYRINGE) ×3 IMPLANT
TIP RUMI ORANGE 6.7MMX12CM (TIP) IMPLANT
TIP UTERINE 5.1X6CM LAV DISP (MISCELLANEOUS) IMPLANT
TIP UTERINE 6.7X10CM GRN DISP (MISCELLANEOUS) IMPLANT
TIP UTERINE 6.7X6CM WHT DISP (MISCELLANEOUS) IMPLANT
TIP UTERINE 6.7X8CM BLUE DISP (MISCELLANEOUS) IMPLANT
TOWEL OR 17X24 6PK STRL BLUE (TOWEL DISPOSABLE) ×9 IMPLANT
TROCAR DISP BLADELESS 8 DVNC (TROCAR) ×1 IMPLANT
TROCAR DISP BLADELESS 8MM (TROCAR) ×2
TROCAR PORT AIRSEAL 5X120 (TROCAR) ×3 IMPLANT
TROCAR Z-THREAD 12X150 (TROCAR) ×3 IMPLANT
WATER STERILE IRR 1000ML POUR (IV SOLUTION) ×3 IMPLANT

## 2016-02-08 NOTE — Progress Notes (Signed)
Patient ID: Kristin Conner, female   DOB: [birth year], 45 y.o.   MRN: [id number] Patient seen and examined. Consent witnessed and signed. No changes noted. Update completed.

## 2016-02-08 NOTE — Anesthesia Procedure Notes (Signed)
Procedure Name: Intubation Date/Time: 02/08/2016 12:12 PM Performed by: Malone Admire G Pre-anesthesia Checklist: Patient identified, Timeout performed, Emergency Drugs available, Suction available and Patient being monitored Patient Re-evaluated:Patient Re-evaluated prior to inductionOxygen Delivery Method: Circle system utilized Preoxygenation: Pre-oxygenation with 100% oxygen Intubation Type: IV induction Ventilation: Mask ventilation without difficulty Laryngoscope Size: Mac and 3 Grade View: Grade I Tube type: Oral Tube size: 7.0 mm Number of attempts: 1 Airway Equipment and Method: Stylet Placement Confirmation: ETT inserted through vocal cords under direct vision,  breath sounds checked- equal and bilateral and positive ETCO2 Secured at: 21 cm Tube secured with: Tape

## 2016-02-08 NOTE — Anesthesia Preprocedure Evaluation (Addendum)
Anesthesia Evaluation  Patient identified by MRN, date of birth, ID band Patient awake    Reviewed: Allergy & Precautions, NPO status , Patient's Chart, lab work & pertinent test results  Airway Mallampati: I  TM Distance: >3 FB Neck ROM: Full    Dental  (+) Dental Advisory Given   Pulmonary neg pulmonary ROS,    breath sounds clear to auscultation       Cardiovascular negative cardio ROS   Rhythm:Regular Rate:Normal     Neuro/Psych negative neurological ROS     GI/Hepatic GERD  ,(+) Hepatitis -, C  Endo/Other  negative endocrine ROS  Renal/GU negative Renal ROS     Musculoskeletal   Abdominal   Peds  Hematology negative hematology ROS (+)   Anesthesia Other Findings   Reproductive/Obstetrics                            Lab Results  Component Value Date   WBC 7.6 01/25/2016   HGB 13.4 01/25/2016   HCT 38.4 01/25/2016   MCV 85.0 01/25/2016   PLT 211 01/25/2016   Lab Results  Component Value Date   CREATININE 0.69 01/25/2016   BUN 15 01/25/2016   NA 140 01/25/2016   K 4.8 01/25/2016   CL 107 01/25/2016   CO2 27 01/25/2016    Anesthesia Physical Anesthesia Plan  ASA: II  Anesthesia Plan: General   Post-op Pain Management:    Induction: Intravenous  Airway Management Planned: Oral ETT  Additional Equipment:   Intra-op Plan:   Post-operative Plan: Extubation in OR  Informed Consent: I have reviewed the patients History and Physical, chart, labs and discussed the procedure including the risks, benefits and alternatives for the proposed anesthesia with the patient or authorized representative who has indicated his/her understanding and acceptance.     Plan Discussed with: CRNA  Anesthesia Plan Comments:         Anesthesia Quick Evaluation

## 2016-02-08 NOTE — Transfer of Care (Signed)
Immediate Anesthesia Transfer of Care Note  Patient: Kristin Conner  Procedure(s) Performed: Procedure(s): ROBOTIC ASSISTED TOTAL HYSTERECTOMY WITH SALPINGECTOMY (Bilateral)  Patient Location: PACU  Anesthesia Type:General  Level of Consciousness: awake and alert   Airway & Oxygen Therapy: Patient Spontanous Breathing and Patient connected to nasal cannula oxygen  Post-op Assessment: Report given to RN and Post -op Vital signs reviewed and stable  Post vital signs: Reviewed and stable  Last Vitals: There were no vitals filed for this visit.  Complications: No apparent anesthesia complications

## 2016-02-08 NOTE — Op Note (Signed)
02/08/2016  3:25 PM  PATIENT:  Kristin Conner  45 y.o. female  PRE-OPERATIVE DIAGNOSIS:  Symptomatic Fibroids  POST-OPERATIVE DIAGNOSIS:  Symptomatic Fibroids PID with bilateral tubal occlusion, Pelvic and perihepatic adhesions Enterocele Pelvic endometriosis  PROCEDURE:  Procedure(s): ROBOTIC ASSISTED TOTAL HYSTERECTOMY  BILATERAL  SALPINGECTOMY LYSIS OF ADHESIONS MCCALL CUL DE PLASTY ABLATION OF PERITONEAL ENDOMETRIOSIS  SURGEON:  Surgeon(s): Brien Few, MD  ASSISTANTS: Janann Colonel, CNM   ANESTHESIA:   local and general  ESTIMATED BLOOD LOSS: 100CC  DRAINS: Urinary Catheter (Foley)   LOCAL MEDICATIONS USED:  MARCAINE    and Amount: 50 ml  SPECIMEN:  Source of Specimen:  UTERUS, CERVIX AND TUBES  DISPOSITION OF SPECIMEN:  PATHOLOGY  COUNTS:  YES  DICTATION #: [id number]  PLAN OF CARE: 24 HOUR EXTENDED  PATIENT DISPOSITION:  PACU - hemodynamically stable.

## 2016-02-09 DIAGNOSIS — D251 Intramural leiomyoma of uterus: Secondary | ICD-10-CM | POA: Diagnosis not present

## 2016-02-09 LAB — CBC
HEMATOCRIT: 32.1 % — AB (ref 36.0–46.0)
Hemoglobin: 11 g/dL — ABNORMAL LOW (ref 12.0–15.0)
MCH: 29.8 pg (ref 26.0–34.0)
MCHC: 34.3 g/dL (ref 30.0–36.0)
MCV: 87 fL (ref 78.0–100.0)
PLATELETS: 199 10*3/uL (ref 150–400)
RBC: 3.69 MIL/uL — ABNORMAL LOW (ref 3.87–5.11)
RDW: 17.2 % — AB (ref 11.5–15.5)
WBC: 10.3 10*3/uL (ref 4.0–10.5)

## 2016-02-09 LAB — BASIC METABOLIC PANEL
Anion gap: 5 (ref 5–15)
BUN: 10 mg/dL (ref 6–20)
CALCIUM: 8.1 mg/dL — AB (ref 8.9–10.3)
CO2: 28 mmol/L (ref 22–32)
Chloride: 107 mmol/L (ref 101–111)
Creatinine, Ser: 0.86 mg/dL (ref 0.44–1.00)
GFR calc Af Amer: >60 mL/min (ref >60)
Glucose, Bld: 138 mg/dL — ABNORMAL HIGH (ref 65–99)
POTASSIUM: 4.2 mmol/L (ref 3.5–5.1)
SODIUM: 140 mmol/L (ref 135–145)

## 2016-02-09 MED ORDER — OXYCODONE-ACETAMINOPHEN 5-325 MG PO TABS
1.0000 | ORAL_TABLET | ORAL | Status: DC | PRN
Start: 2016-02-09 — End: 2019-06-29

## 2016-02-09 MED ORDER — TRAMADOL HCL 50 MG PO TABS: 50.0000 mg | ORAL_TABLET | Freq: Four times a day (QID) | ORAL | Status: DC | PRN

## 2016-02-09 NOTE — Op Note (Signed)
Kristin Conner, Kristin Conner            ACCOUNT NO.:  [id number]  MEDICAL RECORD NO.:  [id number]  LOCATION:  9304                          FACILITY:  [facility]  PHYSICIAN:  Lovenia Kim, M.D.DATE OF BIRTH:  December 22, 1970  DATE OF PROCEDURE: DATE OF DISCHARGE:                              OPERATIVE REPORT   PREOPERATIVE DIAGNOSIS:  Symptomatic fibroids.  POSTOPERATIVE DIAGNOSES:  Symptomatic fibroids, pelvic adhesions, pelvic inflammatory disease, and pelvic endometriosis.  PROCEDURE:  Da Vinci-assisted total laparoscopic hysterectomy, bilateral salpingectomy, lysis of pelvic bilateral periovarian cul-de-sac and bowel adhesions, McCall culdoplasty, ablation of pelvic endometriosis in the left peritoneal sidewall.  SURGEON:  Lovenia Kim, M.D.  ASSISTANT:  Artelia Laroche, C.N.M.  ESTIMATED BLOOD LOSS:  100 mL.  COMPLICATIONS:  None.  DRAINS:  Foley.  COUNTS:  Correct.  DISPOSITION:  The patient to recovery in good condition.  BRIEF OPERATIVE NOTE:  After being apprised of risks of anesthesia, infection, bleeding, injury to surrounding organs, possible need for repair, delayed versus immediate complications to include bowel and bladder injury and possible need for repair, the patient was brought to the operating room, where she was administered general anesthetic without complications.  Prepped and draped in the usual sterile fashion. Foley catheter placed.  Exam under anesthesia revealed a 14-week size uterus, bulky.  No adnexal masses are appreciated.  The uterus sounded to 14 cm and a 10-mm RUMI retractor was placed without difficulty. Supraumbilical incision was then made with a scalpel.  Veress needle placed opening pressure of -2, 3 L of CO2 insufflated without difficulty.  Atraumatic trocar placement supraumbilically and 2 ports on either side were placed.  At this time, deep Trendelenburg position was established.  Robotic trocars were placed, and robot was docked in  a standard fashion with PK ProGrasp and Endo Shears.  At this time, a large bulky uterus was appreciated with adhesions of the left ovary into the left ovarian fossa, the right ovary and right ovarian fossa, bilateral tubal obliteration, perihepatic adhesions were noted. Appendix appears normal.  Anterior cul-de-sac appears normal.  At this time, the adhesiolysis process was started where all bowel adhesions were sharply lysed out from their position obscuring the left adnexa without evidence of bowel injury.  The left ovary was freed from the cul- de-sac using sharp and blunt dissection.  The left tube is excised along the mesosalpinx and good hemostasis was noted.  The retroperitoneal space was entered.  The ureter was dissected sharply off the medial leaf.  The round ligament was opened and the bladder flap was developed sharply.  The uterine vessels skeletonized and cauterized on the left. On the right side, similarly adhesions were lysed from the right ovarian to the right ovarian fossa to the back wall of the uterus.  The left tube was dissected and detached along the mesosalpinx.  Retroperitoneal space was entered.  The ureter was identified and dissected sharply off the medial leaf of the peritoneum.  The round ligament was divided and opened.  The bladder flap was further developed and the uterine vessels on the right were skeletonized cauterized and cut.  The vessels on the left were then cut.  The specimen was detached using monopolar cautery  in a circumferential fashion.  The specimen was then bevelled in the abdomen and extracted vaginally using morcellation technique.  At this time, good hemostasis noted.  Irrigation was accomplished.  The vaginal incision was closed using 0 V-Loc suture in continuous running fashion. A second imbricating layer was placed and a McCall culdoplasty stitch was made.  Ureters were seen peristalsing bilaterally.  Hemostasis was normal, good  hemostasis noted.  Irrigation was accomplished. Instruments removed under direct visualization.  CO2 released.  Trocar sites all closed using 0 Vicryl, 4-0 Vicryl, and Dermabond.  Vaginal exam revealed a well-approximated well-supported vaginal vault and clear urine.  The patient tolerated the procedure well, was awakened, and transferred to recovery in good condition.     Lovenia Kim, M.D.     RJT/MEDQ  D:  02/08/2016  T:  02/09/2016  Job:  [id number]

## 2016-02-09 NOTE — Addendum Note (Signed)
Addendum  created 02/09/16 1035 by Asher Muir, CRNA   Modules edited: Clinical Notes   Clinical Notes:  File: [id number]

## 2016-02-09 NOTE — Progress Notes (Signed)
Discharge instructions reviewed and all questions answered.  IV removed upon discharge no equipment sent home with patient.  All belongings sent home.  Stable condition upon discharge. Escorted by wheelchair to main entrance by NT.

## 2016-02-09 NOTE — Anesthesia Postprocedure Evaluation (Signed)
Anesthesia Post Note  Patient: Kristin Conner  Procedure(s) Performed: Procedure(s) (LRB): ROBOTIC ASSISTED TOTAL HYSTERECTOMY WITH SALPINGECTOMY (Bilateral)  Patient location during evaluation: PACU Anesthesia Type: General Level of consciousness: awake and alert Pain management: pain level controlled Vital Signs Assessment: post-procedure vital signs reviewed and stable Respiratory status: spontaneous breathing, nonlabored ventilation, respiratory function stable and patient connected to nasal cannula oxygen Cardiovascular status: blood pressure returned to baseline and stable Postop Assessment: no signs of nausea or vomiting Anesthetic complications: no    Last Vitals:  Filed Vitals:   02/09/16 0537 02/09/16 0602  BP: 88/54 107/82  Pulse: 70 60  Temp: 37.1 C 36.8 C  Resp: 12 17    Last Pain:  Filed Vitals:   02/09/16 [id number]  PainSc: 2                  Tiajuana Amass

## 2016-02-09 NOTE — Anesthesia Postprocedure Evaluation (Signed)
Anesthesia Post Note  Patient: Kristin Conner  Procedure(s) Performed: Procedure(s) (LRB): ROBOTIC ASSISTED TOTAL HYSTERECTOMY WITH SALPINGECTOMY (Bilateral)  Patient location during evaluation: [facility] Anesthesia Type: General Level of consciousness: awake Pain management: satisfactory to patient Vital Signs Assessment: post-procedure vital signs reviewed and stable Respiratory status: spontaneous breathing Cardiovascular status: stable Anesthetic complications: no    Last Vitals:  Filed Vitals:   02/09/16 0537 02/09/16 0602  BP: 88/54 107/82  Pulse: 70 60  Temp: 37.1 C 36.8 C  Resp: 12 17    Last Pain:  Filed Vitals:   02/09/16 0942  PainSc: 6                  Quincy Prisco

## 2016-02-09 NOTE — Progress Notes (Signed)
1 Day Post-Op Procedure(s) (LRB): ROBOTIC ASSISTED TOTAL HYSTERECTOMY WITH SALPINGECTOMY (Bilateral)  Subjective: Patient reports nausea, incisional pain, tolerating PO, + flatus and no problems voiding.    Objective: BP 107/82 mmHg  Pulse 60  Temp(Src) 98.3 F (36.8 C) (Oral)  Resp 17  Ht 5\' 8"  (1.727 m)  Wt 69.4 kg (153 lb)  BMI 23.27 kg/m2  SpO2 99%  LMP 01/08/2016  CBC    Component Value Date/Time   WBC 10.3 02/09/2016 0539   RBC 3.69* 02/09/2016 0539   HGB 11.0* 02/09/2016 0539   HCT 32.1* 02/09/2016 0539   PLT 199 02/09/2016 0539   MCV 87.0 02/09/2016 0539   MCH 29.8 02/09/2016 0539   MCHC 34.3 02/09/2016 0539   RDW 17.2* 02/09/2016 0539     I have reviewed patient's vital signs and intake and output.  General: alert, cooperative and appears stated age Resp: clear to auscultation bilaterally and normal percussion bilaterally Cardio: regular rate and rhythm, S1, S2 normal, no murmur, click, rub or gallop and normal apical impulse GI: soft, non-tender; bowel sounds normal; no masses,  no organomegaly, normal findings: aorta normal and bowel sounds normal and incision: clean, dry and intact Extremities: extremities normal, atraumatic, no cyanosis or edema and Homans sign is negative, no sign of DVT Vaginal Bleeding: minimal  Assessment: s/p Procedure(s): ROBOTIC ASSISTED TOTAL HYSTERECTOMY WITH SALPINGECTOMY (Bilateral): stable, progressing well and tolerating diet  Plan: Advance diet Encourage ambulation Advance to PO medication Discontinue IV fluids Discharge home     [place] J 02/09/2016, 8:10 AM

## 2016-02-11 ENCOUNTER — Encounter (HOSPITAL_COMMUNITY): Payer: Self-pay | Admitting: Obstetrics and Gynecology

## 2016-12-05 ENCOUNTER — Other Ambulatory Visit: Payer: Self-pay | Admitting: Family Medicine

## 2016-12-05 DIAGNOSIS — Z1231 Encounter for screening mammogram for malignant neoplasm of breast: Secondary | ICD-10-CM

## 2017-01-13 ENCOUNTER — Ambulatory Visit: Payer: BLUE CROSS/BLUE SHIELD

## 2017-01-30 ENCOUNTER — Ambulatory Visit
Admission: RE | Admit: 2017-01-30 | Discharge: 2017-01-30 | Disposition: A | Discharge status: Home / Self Care | Payer: BLUE CROSS/BLUE SHIELD | Source: Ambulatory Visit | Attending: Family Medicine | Admitting: Family Medicine

## 2017-01-30 DIAGNOSIS — Z1231 Encounter for screening mammogram for malignant neoplasm of breast: Secondary | ICD-10-CM

## 2017-12-24 ENCOUNTER — Other Ambulatory Visit: Payer: Self-pay | Admitting: Family Medicine

## 2017-12-24 DIAGNOSIS — Z1231 Encounter for screening mammogram for malignant neoplasm of breast: Secondary | ICD-10-CM

## 2018-02-08 ENCOUNTER — Ambulatory Visit
Admission: RE | Admit: 2018-02-08 | Discharge: 2018-02-08 | Disposition: A | Discharge status: Home / Self Care | Payer: BLUE CROSS/BLUE SHIELD | Source: Ambulatory Visit | Attending: Family Medicine | Admitting: Family Medicine

## 2018-02-08 DIAGNOSIS — Z1231 Encounter for screening mammogram for malignant neoplasm of breast: Secondary | ICD-10-CM

## 2018-12-31 ENCOUNTER — Other Ambulatory Visit: Payer: Self-pay | Admitting: Family Medicine

## 2018-12-31 DIAGNOSIS — Z1231 Encounter for screening mammogram for malignant neoplasm of breast: Secondary | ICD-10-CM

## 2019-01-10 DIAGNOSIS — N951 Menopausal and female climacteric states: Secondary | ICD-10-CM | POA: Insufficient documentation

## 2019-02-10 ENCOUNTER — Ambulatory Visit: Payer: BLUE CROSS/BLUE SHIELD

## 2019-02-23 DIAGNOSIS — Z809 Family history of malignant neoplasm, unspecified: Secondary | ICD-10-CM | POA: Insufficient documentation

## 2019-04-08 ENCOUNTER — Other Ambulatory Visit: Payer: Self-pay

## 2019-04-08 ENCOUNTER — Ambulatory Visit
Admission: RE | Admit: 2019-04-08 | Discharge: 2019-04-08 | Disposition: A | Discharge status: Home / Self Care | Payer: BC Managed Care – PPO | Source: Ambulatory Visit | Attending: Family Medicine | Admitting: Family Medicine

## 2019-04-08 DIAGNOSIS — Z1231 Encounter for screening mammogram for malignant neoplasm of breast: Secondary | ICD-10-CM

## 2019-06-11 DIAGNOSIS — B1710 Acute hepatitis C without hepatic coma: Secondary | ICD-10-CM | POA: Insufficient documentation

## 2019-06-17 ENCOUNTER — Telehealth (INDEPENDENT_AMBULATORY_CARE_PROVIDER_SITE_OTHER): Payer: BC Managed Care – PPO | Admitting: Family Medicine

## 2019-06-17 ENCOUNTER — Telehealth: Payer: BC Managed Care – PPO | Admitting: Family Medicine

## 2019-06-17 ENCOUNTER — Other Ambulatory Visit: Payer: Self-pay

## 2019-06-17 DIAGNOSIS — Z87891 Personal history of nicotine dependence: Secondary | ICD-10-CM | POA: Insufficient documentation

## 2019-06-17 DIAGNOSIS — M79672 Pain in left foot: Secondary | ICD-10-CM | POA: Diagnosis not present

## 2019-06-17 DIAGNOSIS — Z7689 Persons encountering health services in other specified circumstances: Secondary | ICD-10-CM

## 2019-06-17 DIAGNOSIS — F1021 Alcohol dependence, in remission: Secondary | ICD-10-CM

## 2019-06-17 DIAGNOSIS — R634 Abnormal weight loss: Secondary | ICD-10-CM | POA: Diagnosis not present

## 2019-06-17 DIAGNOSIS — F1921 Other psychoactive substance dependence, in remission: Secondary | ICD-10-CM

## 2019-06-17 NOTE — Progress Notes (Signed)
Virtual Visit via Video Note  I connected with Kristin Conner on 06/17/19 at 10:00 AM EDT by a video enabled telemedicine application 2/2 [id number] pandemic and verified that I am speaking with the correct person using two identifiers.  Location patient: home Location provider:work or home office Persons participating in the virtual visit: patient, provider  I discussed the limitations of evaluation and management by telemedicine and the availability of in person appointments. The patient expressed understanding and agreed to proceed.   HPI: Pt is a 48 yo female with pmh sig for GERD, s/p hysterectomy, h/o addiction.  Pt previously seen by  Rachell Cipro.  GERD: -Omeprazole 20 mg  -has been on it for yrs, heard about possible s/e from continued use -coffee causes indigestion.  Has stopped drinking it.  H/o Fibroids: -hysterectomyEstradiol 1 mg  Weight loss -intentional -On the medifast otivia 5 in 1 plan.  Orders foods through the program -lost 30 lbs in 12 wks -was exercising but recently injured foot.  L foot pain -happened a few wks ago.  Was standing at work when pain started. -denies injury.  Was wearing a flat sandal at the time. -seen at [facility]. -Xray without fx per pt -hurts when stands on the ball of her foot -has a walking boot and prednisone taper.  Allergies: NKDA  Hysterectomy 2017- heavy bleeding and pain 2/2 fibroids Laminectomy as a teen Hernia in L groin  Social Hx: Pt works at [employer].  Quit smoking in 2012.  Used wellbutrin to quit.  Started at 12 quit at 65.  Recovering addict and alcohol, clean and sober x 11 yrs.  Mammogram- goes every yr.  Family Hx: Mom-COPD, chronic back issues MGM- smoker, emphysema MGF-smoker, emphysema Paternal uncle- liver cancer  ROS: See pertinent positives and negatives per HPI.  Past Medical History:  Diagnosis Date  . Anemia    history of  . GERD (gastroesophageal reflux disease)   . Hepatitis C  antibody test positive   . Inguinal hernia   . Vitamin D deficiency     Past Surgical History:  Procedure Laterality Date  . BACK SURGERY    . HERNIA REPAIR     with mesh  . LAMINECTOMY    . ROBOTIC ASSISTED TOTAL HYSTERECTOMY WITH SALPINGECTOMY Bilateral 02/08/2016   Procedure: ROBOTIC ASSISTED TOTAL HYSTERECTOMY WITH SALPINGECTOMY;  Surgeon: Brien Few, MD;  Location: [facility];  Service: Gynecology;  Laterality: Bilateral;    Family History  Problem Relation Age of Onset  . Breast cancer Neg Hx      Current Outpatient Medications:  .  BIOTIN PO, Take 3,000 mg by mouth daily., Disp: , Rfl:  .  Cholecalciferol (VITAMIN D3) 5000 units CAPS, Take 1 capsule by mouth daily., Disp: , Rfl:  .  ibuprofen (ADVIL,MOTRIN) 200 MG tablet, Take 800 mg by mouth every 6 (six) hours as needed for headache or mild pain., Disp: , Rfl:  .  Melatonin 10 MG CAPS, Take 1 capsule by mouth daily as needed (sleep)., Disp: , Rfl:  .  oxyCODONE-acetaminophen (PERCOCET/ROXICET) 5-325 MG tablet, Take 1-2 tablets by mouth every 4 (four) hours as needed for severe pain., Disp: 40 tablet, Rfl: 0 .  traMADol (ULTRAM) 50 MG tablet, Take 1-2 tablets (50-100 mg total) by mouth every 6 (six) hours as needed for moderate pain., Disp: 30 tablet, Rfl: 0 .  triamcinolone ointment (KENALOG) 0.1 %, Apply 1 application topically at bedtime., Disp: , Rfl:   EXAM:  VITALS per patient if applicable:  RR between 12-20 bpm   GENERAL: alert, oriented, appears well and in no acute distress  HEENT: atraumatic, conjunctiva clear, no obvious abnormalities on inspection of external nose and ears  NECK: normal movements of the head and neck  LUNGS: on inspection no signs of respiratory distress, breathing rate appears normal, no obvious gross SOB, gasping or wheezing  CV: no obvious cyanosis  MS: moves all visible extremities without noticeable abnormality  PSYCH/NEURO: pleasant and cooperative, no obvious depression or  anxiety, speech and thought processing grossly intact  ASSESSMENT AND PLAN:  Discussed the following assessment and plan:  Left foot pain  -seen at [facility], pt reports xray without fx.  Will work to get records and imaging -continue walking boot and prednisone taper -will re-evaluate in the next few wks. -consider referral to sports med for continued symptoms  Former smoker  -Advertising account executive on quitting -continued smoking cessation encouraged  History of alcoholism ([place])  -Sober x 11 yrs  Weight loss -continue current meal plan -discussed lifestyle modifications.  Will start exercising when able  Drug addiction in remission ([place])  -clean x 11 yrs.  Encounter to establish care  -We reviewed the PMH, PSH, FH, SH, Meds and Allergies. -We provided refills for any medications we will prescribe as needed. -We addressed current concerns per orders and patient instructions. -We have asked for records for pertinent exams, studies, vaccines and notes from previous providers. -We have advised patient to follow up per instructions below.  F/u prn in the next month for CPE   I discussed the assessment and treatment plan with the patient. The patient was provided an opportunity to ask questions and all were answered. The patient agreed with the plan and demonstrated an understanding of the instructions.   The patient was advised to call back or seek an in-person evaluation if the symptoms worsen or if the condition fails to improve as anticipated.   Kristin Ruddy, MD

## 2019-06-29 ENCOUNTER — Telehealth: Payer: BC Managed Care – PPO

## 2019-06-29 ENCOUNTER — Ambulatory Visit (HOSPITAL_COMMUNITY)
Admission: EM | Admit: 2019-06-29 | Discharge: 2019-06-29 | Disposition: A | Discharge status: Home / Self Care | Payer: BC Managed Care – PPO

## 2019-06-29 ENCOUNTER — Encounter (HOSPITAL_COMMUNITY): Payer: Self-pay | Admitting: Emergency Medicine

## 2019-06-29 ENCOUNTER — Other Ambulatory Visit: Payer: Self-pay

## 2019-06-29 DIAGNOSIS — E860 Dehydration: Secondary | ICD-10-CM | POA: Diagnosis not present

## 2019-06-29 DIAGNOSIS — K529 Noninfective gastroenteritis and colitis, unspecified: Secondary | ICD-10-CM

## 2019-06-29 DIAGNOSIS — R42 Dizziness and giddiness: Secondary | ICD-10-CM | POA: Diagnosis not present

## 2019-06-29 DIAGNOSIS — R1030 Lower abdominal pain, unspecified: Secondary | ICD-10-CM

## 2019-06-29 DIAGNOSIS — R197 Diarrhea, unspecified: Secondary | ICD-10-CM

## 2019-06-29 MED ORDER — LOPERAMIDE HCL 2 MG PO CAPS: 2.0000 mg | ORAL_CAPSULE | Freq: Every day | ORAL | 0 refills | Status: DC | PRN

## 2019-06-29 MED ORDER — SODIUM CHLORIDE 0.9 % IV BOLUS
1000.0000 mL | Freq: Once | INTRAVENOUS | Status: AC
  Administered 2019-06-29: 1000 mL via INTRAVENOUS

## 2019-06-29 NOTE — ED Triage Notes (Signed)
PT reports abdominal pain and diarrhea started Saturday. Estimates more than 12 BMs a day. PT developed dizziness last night.

## 2019-06-29 NOTE — ED Provider Notes (Addendum)
MRN: [id number] DOB: [birth year]  Subjective:   Kristin VERDOORN is a 48 y.o. female presenting for 4-day history of persistent watery diarrhea, having 10-12 bowel movements per day.  Has now been having worsening malaise, fatigue, intermittent lower abdominal pain, dizziness.  Has been trying to stay hydrated.  Has not used any oral medications for her symptoms.  Only inciting factor she can recall is eating seafood on the weekend.  Patient is taking estrogen, Vitamin D, Mobic, melatonin. No recent antibiotics, hospitalizations.  History of IV drug use, drug addiction but patient is currently sober.   No current facility-administered medications for this encounter.   Current Outpatient Medications:  .  Cholecalciferol (VITAMIN D3) 5000 units CAPS, Take 1 capsule by mouth daily., Disp: , Rfl:  .  estradiol (ESTRACE) 1 MG tablet, Take 1 mg by mouth daily., Disp: , Rfl:  .  Melatonin 10 MG CAPS, Take 1 capsule by mouth daily as needed (sleep)., Disp: , Rfl:  .  meloxicam (MOBIC) 15 MG tablet, Take 15 mg by mouth daily., Disp: , Rfl:  .  omeprazole (PRILOSEC) 20 MG capsule, Take 20 mg by mouth daily., Disp: , Rfl:  .  BIOTIN PO, Take 3,000 mg by mouth daily., Disp: , Rfl:  .  ibuprofen (ADVIL,MOTRIN) 200 MG tablet, Take 800 mg by mouth every 6 (six) hours as needed for headache or mild pain., Disp: , Rfl:  .  oxyCODONE-acetaminophen (PERCOCET/ROXICET) 5-325 MG tablet, Take 1-2 tablets by mouth every 4 (four) hours as needed for severe pain., Disp: 40 tablet, Rfl: 0 .  traMADol (ULTRAM) 50 MG tablet, Take 1-2 tablets (50-100 mg total) by mouth every 6 (six) hours as needed for moderate pain., Disp: 30 tablet, Rfl: 0 .  triamcinolone ointment (KENALOG) 0.1 %, Apply 1 application topically at bedtime., Disp: , Rfl:     No Known Allergies   Past Medical History:  Diagnosis Date  . Anemia    history of  . GERD (gastroesophageal reflux disease)   . Hepatitis C antibody test positive   .  Inguinal hernia   . Vitamin D deficiency      Past Surgical History:  Procedure Laterality Date  . BACK SURGERY    . HERNIA REPAIR     with mesh  . LAMINECTOMY    . ROBOTIC ASSISTED TOTAL HYSTERECTOMY WITH SALPINGECTOMY Bilateral 02/08/2016   Procedure: ROBOTIC ASSISTED TOTAL HYSTERECTOMY WITH SALPINGECTOMY;  Surgeon: Brien Few, MD;  Location: [facility];  Service: Gynecology;  Laterality: Bilateral;    Family History  Problem Relation Age of Onset  . Breast cancer Neg Hx      Review of Systems  Constitutional: Positive for malaise/fatigue. Negative for fever.  HENT: Negative for congestion, ear pain, sinus pain and sore throat.   Eyes: Negative for blurred vision, double vision, discharge and redness.  Respiratory: Negative for cough, hemoptysis, shortness of breath and wheezing.   Cardiovascular: Negative for chest pain.  Gastrointestinal: Positive for abdominal pain (intermittent, comes in waves and closely associated with need/urgency to defecate) and diarrhea. Negative for blood in stool, nausea and vomiting.  Genitourinary: Negative for dysuria, flank pain and hematuria.  Musculoskeletal: Negative for myalgias.  Skin: Negative for rash.  Neurological: Positive for dizziness. Negative for weakness and headaches.  Psychiatric/Behavioral: Negative for depression and substance abuse.    Objective:   Vitals: BP 102/71   Pulse 68   Temp 97.9 F (36.6 C) (Temporal)   Resp 16   LMP 01/19/2016 (Approximate)  SpO2 97%    Physical Exam Constitutional:      General: She is not in acute distress.    Appearance: Normal appearance. She is well-developed and normal weight. She is not ill-appearing, toxic-appearing or diaphoretic.  HENT:     Head: Normocephalic and atraumatic.     Right Ear: External ear normal.     Left Ear: External ear normal.     Nose: Nose normal.     Mouth/Throat:     Mouth: Mucous membranes are moist.     Pharynx: Oropharynx is clear.  Eyes:      General: No scleral icterus.    Extraocular Movements: Extraocular movements intact.     Pupils: Pupils are equal, round, and reactive to light.  Cardiovascular:     Rate and Rhythm: Normal rate and regular rhythm.     Pulses: Normal pulses.     Heart sounds: Normal heart sounds. No murmur. No friction rub. No gallop.   Pulmonary:     Effort: Pulmonary effort is normal. No respiratory distress.     Breath sounds: Normal breath sounds. No stridor. No wheezing, rhonchi or rales.  Abdominal:     General: Bowel sounds are normal. There is no distension.     Palpations: Abdomen is soft. There is no mass.     Tenderness: There is no abdominal tenderness. There is no right CVA tenderness, left CVA tenderness, guarding or rebound.  Skin:    General: Skin is warm and dry.     Coloration: Skin is not pale.     Findings: No rash.  Neurological:     General: No focal deficit present.     Mental Status: She is alert and oriented to person, place, and time.  Psychiatric:        Mood and Affect: Mood normal.        Behavior: Behavior normal.        Thought Content: Thought content normal.        Judgment: Judgment normal.    IV fluids given.  Assessment and Plan :   1. Diarrhea, unspecified type   2. Dehydration   3. Gastroenteritis   4. Lower abdominal pain     We discussed differential which includes possible C. difficile although unlikely given lack of bloody stools, fever, recent antibiotic use or hospitalizations.  Patient is very concerned however given her persistent diarrhea and now symptoms of dehydration including dizziness, headaches, abdominal pain.  Therefore we will pursue stool culture.  We will manage for gastroenteritis with supportive care.  Recommended patient hydrate well, eat light meals and maintain electrolytes.  Will use Imodium for diarrhea. Counseled patient on potential for adverse effects with medications prescribed/recommended today, ER and return-to-clinic  precautions discussed, patient verbalized understanding.    Jaynee Eagles, PA-C 06/29/19 1432    Jaynee Eagles, PA-C 07/11/19 1227

## 2019-07-01 ENCOUNTER — Telehealth (HOSPITAL_COMMUNITY): Payer: Self-pay | Admitting: Emergency Medicine

## 2019-07-01 NOTE — Telephone Encounter (Signed)
Pt returned and provided stool sample.

## 2019-07-02 LAB — GASTROINTESTINAL PANEL BY PCR, STOOL (REPLACES STOOL CULTURE)

## 2019-07-13 ENCOUNTER — Ambulatory Visit (INDEPENDENT_AMBULATORY_CARE_PROVIDER_SITE_OTHER): Payer: BC Managed Care – PPO | Admitting: Family Medicine

## 2019-07-13 ENCOUNTER — Encounter: Payer: Self-pay | Admitting: Family Medicine

## 2019-07-13 ENCOUNTER — Other Ambulatory Visit: Payer: Self-pay

## 2019-07-13 VITALS — BP 96/68 | HR 59 | Temp 97.7°F | Wt 163.0 lb

## 2019-07-13 DIAGNOSIS — R5383 Other fatigue: Secondary | ICD-10-CM | POA: Diagnosis not present

## 2019-07-13 DIAGNOSIS — Z13220 Encounter for screening for lipoid disorders: Secondary | ICD-10-CM

## 2019-07-13 DIAGNOSIS — Z0000 Encounter for general adult medical examination without abnormal findings: Secondary | ICD-10-CM

## 2019-07-13 DIAGNOSIS — F5101 Primary insomnia: Secondary | ICD-10-CM

## 2019-07-13 LAB — CBC WITH DIFFERENTIAL/PLATELET
Basophils Absolute: 0 10*3/uL (ref 0.0–0.1)
Basophils Relative: 0.6 % (ref 0.0–3.0)
Eosinophils Absolute: 0.3 10*3/uL (ref 0.0–0.7)
Eosinophils Relative: 4 % (ref 0.0–5.0)
HCT: 42.3 % (ref 36.0–46.0)
Hemoglobin: 14.7 g/dL (ref 12.0–15.0)
Lymphocytes Relative: 21.3 % (ref 12.0–46.0)
Lymphs Abs: 1.5 10*3/uL (ref 0.7–4.0)
MCHC: 34.7 g/dL (ref 30.0–36.0)
MCV: 93.1 fl (ref 78.0–100.0)
Monocytes Absolute: 0.5 10*3/uL (ref 0.1–1.0)
Monocytes Relative: 7.7 % (ref 3.0–12.0)
Neutro Abs: 4.6 10*3/uL (ref 1.4–7.7)
Neutrophils Relative %: 66.4 % (ref 43.0–77.0)
Platelets: 269 10*3/uL (ref 150.0–400.0)
RBC: 4.54 Mil/uL (ref 3.87–5.11)
RDW: 13.8 % (ref 11.5–15.5)
WBC: 7 10*3/uL (ref 4.0–10.5)

## 2019-07-13 LAB — LIPID PANEL
Cholesterol: 205 mg/dL — ABNORMAL HIGH (ref 0–200)
HDL: 52.9 mg/dL (ref >39.00)
LDL Cholesterol: 127 mg/dL — ABNORMAL HIGH (ref 0–99)
NonHDL: 152.37
Total CHOL/HDL Ratio: 4
Triglycerides: 128 mg/dL (ref 0.0–149.0)
VLDL: 25.6 mg/dL (ref 0.0–40.0)

## 2019-07-13 LAB — BASIC METABOLIC PANEL
BUN: 17 mg/dL (ref 6–23)
CO2: 28 mEq/L (ref 19–32)
Calcium: 9.9 mg/dL (ref 8.4–10.5)
Chloride: 101 mEq/L (ref 96–112)
Creatinine, Ser: 0.73 mg/dL (ref 0.40–1.20)
GFR: 84.41 mL/min (ref >60.00)
Glucose, Bld: 87 mg/dL (ref 70–99)
Potassium: 4.6 mEq/L (ref 3.5–5.1)
Sodium: 138 mEq/L (ref 135–145)

## 2019-07-13 LAB — TSH: TSH: 0.68 u[IU]/mL (ref 0.35–4.50)

## 2019-07-13 LAB — T4, FREE: Free T4: 0.81 ng/dL (ref 0.60–1.60)

## 2019-07-13 LAB — HEMOGLOBIN A1C: Hgb A1c MFr Bld: 5 % (ref 4.6–6.5)

## 2019-07-13 NOTE — Progress Notes (Signed)
Subjective:     Kristin Conner is a 48 y.o. female and is here for a comprehensive physical exam. The patient reports problems - foot pain, insomnia, fatigue.  Pt notes L foot pain x 1 month.  States had negative xray, given a boot to wear x 3 wks.  Pain in 1st metatarsal with stepping on foot.  Denies injury.  Unsure if pain is on dorsal or plantar surface.  Has appt with podiatry coming up.    Pt notes waking up in the middle of the night with difficulty returning to sleep.  Pt able to fall asleep without issue.  Denies being told she snores.  Pt feels tired in the am.  Has tried benadryl and melatonin.  Does not want rx meds but is almost willing to consider.  Drinks caffeine throughout the day.  Mammogram up to date. S/p hysterectomy, but followed by OB/Gyn for pelvic exams.  Social History   Socioeconomic History  . Marital status: Single    Spouse name: Not on file  . Number of children: Not on file  . Years of education: Not on file  . Highest education level: Not on file  Occupational History  . Not on file  Social Needs  . Financial resource strain: Not on file  . Food insecurity    Worry: Not on file    Inability: Not on file  . Transportation needs    Medical: Not on file    Non-medical: Not on file  Tobacco Use  . Smoking status: Never Smoker  . Smokeless tobacco: Never Used  Substance and Sexual Activity  . Alcohol use: No  . Drug use: No  . Sexual activity: Yes    Birth control/protection: None  Lifestyle  . Physical activity    Days per week: Not on file    Minutes per session: Not on file  . Stress: Not on file  Relationships  . Social Herbalist on phone: Not on file    Gets together: Not on file    Attends religious service: Not on file    Active member of club or organization: Not on file    Attends meetings of clubs or organizations: Not on file    Relationship status: Not on file  . Intimate partner violence    Fear of current or ex  partner: Not on file    Emotionally abused: Not on file    Physically abused: Not on file    Forced sexual activity: Not on file  Other Topics Concern  . Not on file  Social History Narrative  . Not on file   Health Maintenance  Topic Date Due  . HIV Screening  08/09/1984  . TETANUS/TDAP  08/09/1988  . PAP SMEAR-Modifier  08/09/1990  . INFLUENZA VACCINE  06/04/2019    The following portions of the patient's history were reviewed and updated as appropriate: allergies, current medications, past family history, past medical history, past social history, past surgical history and problem list.  Review of Systems Pertinent items noted in HPI and remainder of comprehensive ROS otherwise negative.   Objective:    BP 96/68 (BP Location: Left Arm, Patient Position: Sitting, Cuff Size: Normal)   Pulse (!) 59   Temp 97.7 F (36.5 C) (Oral)   Wt 163 lb (73.9 kg)   LMP 01/19/2016 (Approximate)   SpO2 98%   BMI 24.78 kg/m  General appearance: alert, cooperative and no distress Head: Normocephalic, without obvious abnormality, atraumatic  Eyes: conjunctivae/corneas clear. PERRL, EOM's intact. Fundi benign. Ears: normal TM's and external ear canals both ears Nose: Nares normal. Septum midline. Mucosa normal. No drainage or sinus tenderness. Throat: lips, mucosa, and tongue normal; teeth and gums normal Neck: no adenopathy, no carotid bruit, no JVD, supple, symmetrical, trachea midline and thyroid not enlarged, symmetric, no tenderness/mass/nodules Lungs: clear to auscultation bilaterally Heart: regular rate and rhythm, S1, S2 normal, no murmur, click, rub or gallop Abdomen: soft, non-tender; bowel sounds normal; no masses,  no organomegaly Extremities: extremities normal, atraumatic, no cyanosis or edema Pulses: 2+ and symmetric Skin: Skin color, texture, turgor normal. No rashes or lesions Lymph nodes: Cervical, supraclavicular, and axillary nodes normal. Neurologic: Alert and oriented  X 3, normal strength and tone. Normal symmetric reflexes. Normal coordination and gait    Assessment:    Healthy female exam.      Plan:     Anticipatory guidance given including wearing seatbelts, smoke detectors in the home, increasing physical activity, increasing p.o. intake of water and vegetables. -will obtain labs -mammogram up to date -given handout -next CPE in 1 yr See After Visit Summary for Counseling Recommendations    Primary insomnia -discussed sleep hygiene. -given handouts -if continues to have difficulty consider Trazodone or Seroquel given h/o addiction  - Plan: TSH, T4, Free  Fatigue, unspecified type  -given handout - Plan: CBC with Differential/Platelet, Basic metabolic panel, TSH, T4, Free, Hemoglobin A1c, Vitamin D, 25-hydroxy  Screening for cholesterol level  - Plan: Lipid panel  F/u in 1 month prn for insomnia and fatiuge  Grier Mitts, MD

## 2019-07-13 NOTE — Patient Instructions (Signed)
Preventive Care 40-50 Years Old, Female Preventive care refers to visits with your health care provider and lifestyle choices that can promote health and wellness. This includes:  A yearly physical exam. This may also be called an annual well check.  Regular dental visits and eye exams.  Immunizations.  Screening for certain conditions.  Healthy lifestyle choices, such as eating a healthy diet, getting regular exercise, not using drugs or products that contain nicotine and tobacco, and limiting alcohol use. What can I expect for my preventive care visit? Physical exam Your health care provider will check your:  Height and weight. This may be used to calculate body mass index (BMI), which tells if you are at a healthy weight.  Heart rate and blood pressure.  Skin for abnormal spots. Counseling Your health care provider may ask you questions about your:  Alcohol, tobacco, and drug use.  Emotional well-being.  Home and relationship well-being.  Sexual activity.  Eating habits.  Work and work environment.  Method of birth control.  Menstrual cycle.  Pregnancy history. What immunizations do I need?  Influenza (flu) vaccine  This is recommended every year. Tetanus, diphtheria, and pertussis (Tdap) vaccine  You may need a Td booster every 10 years. Varicella (chickenpox) vaccine  You may need this if you have not been vaccinated. Zoster (shingles) vaccine  You may need this after age 60. Measles, mumps, and rubella (MMR) vaccine  You may need at least one dose of MMR if you were born in 1957 or later. You may also need a second dose. Pneumococcal conjugate (PCV13) vaccine  You may need this if you have certain conditions and were not previously vaccinated. Pneumococcal polysaccharide (PPSV23) vaccine  You may need one or two doses if you smoke cigarettes or if you have certain conditions. Meningococcal conjugate (MenACWY) vaccine  You may need this if you  have certain conditions. Hepatitis A vaccine  You may need this if you have certain conditions or if you travel or work in places where you may be exposed to hepatitis A. Hepatitis B vaccine  You may need this if you have certain conditions or if you travel or work in places where you may be exposed to hepatitis B. Haemophilus influenzae type b (Hib) vaccine  You may need this if you have certain conditions. Human papillomavirus (HPV) vaccine  If recommended by your health care provider, you may need three doses over 6 months. You may receive vaccines as individual doses or as more than one vaccine together in one shot (combination vaccines). Talk with your health care provider about the risks and benefits of combination vaccines. What tests do I need? Blood tests  Lipid and cholesterol levels. These may be checked every 5 years, or more frequently if you are over 50 years old.  Hepatitis C test.  Hepatitis B test. Screening  Lung cancer screening. You may have this screening every year starting at age 55 if you have a 30-pack-year history of smoking and currently smoke or have quit within the past 15 years.  Colorectal cancer screening. All adults should have this screening starting at age 50 and continuing until age 75. Your health care provider may recommend screening at age 45 if you are at increased risk. You will have tests every 1-10 years, depending on your results and the type of screening test.  Diabetes screening. This is done by checking your blood sugar (glucose) after you have not eaten for a while (fasting). You may have this   done every 1-3 years.  Mammogram. This may be done every 1-2 years. Talk with your health care provider about when you should start having regular mammograms. This may depend on whether you have a family history of breast cancer.  BRCA-related cancer screening. This may be done if you have a family history of breast, ovarian, tubal, or peritoneal  cancers.  Pelvic exam and Pap test. This may be done every 3 years starting at age 27. Starting at age 31, this may be done every 5 years if you have a Pap test in combination with an HPV test. Other tests  Sexually transmitted disease (STD) testing.  Bone density scan. This is done to screen for osteoporosis. You may have this scan if you are at high risk for osteoporosis. Follow these instructions at home: Eating and drinking  Eat a diet that includes fresh fruits and vegetables, whole grains, lean protein, and low-fat dairy.  Take vitamin and mineral supplements as recommended by your health care provider.  Do not drink alcohol if: ? Your health care provider tells you not to drink. ? You are pregnant, may be pregnant, or are planning to become pregnant.  If you drink alcohol: ? Limit how much you have to 0-1 drink a day. ? Be aware of how much alcohol is in your drink. In the [place] one drink equals one 12 oz bottle of beer (355 mL), one 5 oz glass of wine (148 mL), or one 1 oz glass of hard liquor (44 mL). Lifestyle  Take daily care of your teeth and gums.  Stay active. Exercise for at least 30 minutes on 5 or more days each week.  Do not use any products that contain nicotine or tobacco, such as cigarettes, e-cigarettes, and chewing tobacco. If you need help quitting, ask your health care provider.  If you are sexually active, practice safe sex. Use a condom or other form of birth control (contraception) in order to prevent pregnancy and STIs (sexually transmitted infections).  If told by your health care provider, take low-dose aspirin daily starting at age 59. What's next?  Visit your health care provider once a year for a well check visit.  Ask your health care provider how often you should have your eyes and teeth checked.  Stay up to date on all vaccines. This information is not intended to replace advice given to you by your health care provider. Make sure you  discuss any questions you have with your health care provider. Document Released: 11/16/2015 Document Revised: 07/01/2018 Document Reviewed: 07/01/2018 Elsevier Patient Education  [employer].  Fatigue If you have fatigue, you feel tired all the time and have a lack of energy or a lack of motivation. Fatigue may make it difficult to start or complete tasks because of exhaustion. In general, occasional or mild fatigue is often a normal response to activity or life. However, long-lasting (chronic) or extreme fatigue may be a symptom of a medical condition. Follow these instructions at home: General instructions  Watch your fatigue for any changes.  Go to bed and get up at the same time every day.  Avoid fatigue by pacing yourself during the day and getting enough sleep at night.  Maintain a healthy weight. Medicines  Take over-the-counter and prescription medicines only as told by your health care provider.  Take a multivitamin, if told by your health care provider.  Do not use herbal or dietary supplements unless they are approved by your health care provider.  Activity   Exercise regularly, as told by your health care provider.  Use or practice techniques to help you relax, such as yoga, tai chi, meditation, or massage therapy. Eating and drinking   Avoid heavy meals in the evening.  Eat a well-balanced diet, which includes lean proteins, whole grains, plenty of fruits and vegetables, and low-fat dairy products.  Avoid consuming too much caffeine.  Avoid the use of alcohol.  Drink enough fluid to keep your urine pale yellow. Lifestyle  Change situations that cause you stress. Try to keep your work and personal schedule in balance.  Do not use any products that contain nicotine or tobacco, such as cigarettes and e-cigarettes. If you need help quitting, ask your health care provider.  Do not use drugs. Contact a health care provider if:  Your fatigue does not  get better.  You have a fever.  You suddenly lose or gain weight.  You have headaches.  You have trouble falling asleep or sleeping through the night.  You feel angry, guilty, anxious, or sad.  You are unable to have a bowel movement (constipation).  Your skin is dry.  You have swelling in your legs or another part of your body. Get help right away if:  You feel confused.  Your vision is blurry.  You feel faint or you pass out.  You have a severe headache.  You have severe pain in your abdomen, your back, or the area between your waist and hips (pelvis).  You have chest pain, shortness of breath, or an irregular or fast heartbeat.  You are unable to urinate, or you urinate less than normal.  You have abnormal bleeding, such as bleeding from the rectum, vagina, nose, lungs, or nipples.  You vomit blood.  You have thoughts about hurting yourself or others. If you ever feel like you may hurt yourself or others, or have thoughts about taking your own life, get help right away. You can go to your nearest emergency department or call:  Your local emergency services (911 in the [place]  A suicide crisis helpline, such as the [facility] at 330 832 1865. This is open 24 hours a day. Summary  If you have fatigue, you feel tired all the time and have a lack of energy or a lack of motivation.  Fatigue may make it difficult to start or complete tasks because of exhaustion.  Long-lasting (chronic) or extreme fatigue may be a symptom of a medical condition.  Exercise regularly, as told by your health care provider.  Change situations that cause you stress. Try to keep your work and personal schedule in balance. This information is not intended to replace advice given to you by your health care provider. Make sure you discuss any questions you have with your health care provider. Document Released: 08/17/2007 Document Revised: 02/10/2019 Document  Reviewed: 07/15/2017 Elsevier Patient Education  2020 [place].  Insomnia Insomnia is a sleep disorder that makes it difficult to fall asleep or stay asleep. Insomnia can cause fatigue, low energy, difficulty concentrating, mood swings, and poor performance at work or school. There are three different ways to classify insomnia:  Difficulty falling asleep.  Difficulty staying asleep.  Waking up too early in the morning. Any type of insomnia can be long-term (chronic) or short-term (acute). Both are common. Short-term insomnia usually lasts for three months or less. Chronic insomnia occurs at least three times a week for longer than three months. What are the causes? Insomnia may be  caused by another condition, situation, or substance, such as:  Anxiety.  Certain medicines.  Gastroesophageal reflux disease (GERD) or other gastrointestinal conditions.  Asthma or other breathing conditions.  Restless legs syndrome, sleep apnea, or other sleep disorders.  Chronic pain.  Menopause.  Stroke.  Abuse of alcohol, tobacco, or illegal drugs.  Mental health conditions, such as depression.  Caffeine.  Neurological disorders, such as Alzheimer's disease.  An overactive thyroid (hyperthyroidism). Sometimes, the cause of insomnia may not be known. What increases the risk? Risk factors for insomnia include:  Gender. Women are affected more often than men.  Age. Insomnia is more common as you get older.  Stress.  Lack of exercise.  Irregular work schedule or working night shifts.  Traveling between different time zones.  Certain medical and mental health conditions. What are the signs or symptoms? If you have insomnia, the main symptom is having trouble falling asleep or having trouble staying asleep. This may lead to other symptoms, such as:  Feeling fatigued or having low energy.  Feeling nervous about going to sleep.  Not feeling rested in the morning.  Having  trouble concentrating.  Feeling irritable, anxious, or depressed. How is this diagnosed? This condition may be diagnosed based on:  Your symptoms and medical history. Your health care provider may ask about: ? Your sleep habits. ? Any medical conditions you have. ? Your mental health.  A physical exam. How is this treated? Treatment for insomnia depends on the cause. Treatment may focus on treating an underlying condition that is causing insomnia. Treatment may also include:  Medicines to help you sleep.  Counseling or therapy.  Lifestyle adjustments to help you sleep better. Follow these instructions at home: Eating and drinking   Limit or avoid alcohol, caffeinated beverages, and cigarettes, especially close to bedtime. These can disrupt your sleep.  Do not eat a large meal or eat spicy foods right before bedtime. This can lead to digestive discomfort that can make it hard for you to sleep. Sleep habits   Keep a sleep diary to help you and your health care provider figure out what could be causing your insomnia. Write down: ? When you sleep. ? When you wake up during the night. ? How well you sleep. ? How rested you feel the next day. ? Any side effects of medicines you are taking. ? What you eat and drink.  Make your bedroom a dark, comfortable place where it is easy to fall asleep. ? Put up shades or blackout curtains to block light from outside. ? Use a white noise machine to block noise. ? Keep the temperature cool.  Limit screen use before bedtime. This includes: ? Watching TV. ? Using your smartphone, tablet, or computer.  Stick to a routine that includes going to bed and waking up at the same times every day and night. This can help you fall asleep faster. Consider making a quiet activity, such as reading, part of your nighttime routine.  Try to avoid taking naps during the day so that you sleep better at night.  Get out of bed if you are still awake after  15 minutes of trying to sleep. Keep the lights down, but try reading or doing a quiet activity. When you feel sleepy, go back to bed. General instructions  Take over-the-counter and prescription medicines only as told by your health care provider.  Exercise regularly, as told by your health care provider. Avoid exercise starting several hours before bedtime.  Use  relaxation techniques to manage stress. Ask your health care provider to suggest some techniques that may work well for you. These may include: ? Breathing exercises. ? Routines to release muscle tension. ? Visualizing peaceful scenes.  Make sure that you drive carefully. Avoid driving if you feel very sleepy.  Keep all follow-up visits as told by your health care provider. This is important. Contact a health care provider if:  You are tired throughout the day.  You have trouble in your daily routine due to sleepiness.  You continue to have sleep problems, or your sleep problems get worse. Get help right away if:  You have serious thoughts about hurting yourself or someone else. If you ever feel like you may hurt yourself or others, or have thoughts about taking your own life, get help right away. You can go to your nearest emergency department or call:  Your local emergency services (911 in the [place]  A suicide crisis helpline, such as the [facility] at 539 253 0548. This is open 24 hours a day. Summary  Insomnia is a sleep disorder that makes it difficult to fall asleep or stay asleep.  Insomnia can be long-term (chronic) or short-term (acute).  Treatment for insomnia depends on the cause. Treatment may focus on treating an underlying condition that is causing insomnia.  Keep a sleep diary to help you and your health care provider figure out what could be causing your insomnia. This information is not intended to replace advice given to you by your health care provider. Make sure you  discuss any questions you have with your health care provider. Document Released: 10/17/2000 Document Revised: 10/02/2017 Document Reviewed: 07/30/2017 Elsevier Patient Education  2020 [employer].

## 2019-07-14 LAB — VITAMIN D 25 HYDROXY (VIT D DEFICIENCY, FRACTURES): VITD: 104.35 ng/mL (ref 30.00–100.00)

## 2019-07-19 ENCOUNTER — Other Ambulatory Visit: Payer: Self-pay

## 2019-07-19 ENCOUNTER — Other Ambulatory Visit: Payer: Self-pay | Admitting: Podiatry

## 2019-07-19 ENCOUNTER — Ambulatory Visit (INDEPENDENT_AMBULATORY_CARE_PROVIDER_SITE_OTHER): Payer: BC Managed Care – PPO

## 2019-07-19 ENCOUNTER — Encounter: Payer: Self-pay | Admitting: Podiatry

## 2019-07-19 ENCOUNTER — Ambulatory Visit (INDEPENDENT_AMBULATORY_CARE_PROVIDER_SITE_OTHER): Payer: BC Managed Care – PPO | Admitting: Podiatry

## 2019-07-19 VITALS — BP 97/65 | HR 56 | Resp 16

## 2019-07-19 DIAGNOSIS — M84375A Stress fracture, left foot, initial encounter for fracture: Secondary | ICD-10-CM

## 2019-07-19 DIAGNOSIS — M778 Other enthesopathies, not elsewhere classified: Secondary | ICD-10-CM

## 2019-07-19 NOTE — Progress Notes (Signed)
Subjective:  Patient ID: Kristin Conner, female    DOB: 1971/03/02,  MRN: [id number] HPI Chief Complaint  Patient presents with  . Foot Pain    Dorsal forefoot left - aching x 1 month, swelling, no injury, went to urgent care-xrayed-thought tendon problem, put in boot, Rx'd mobic/prednisone taper, taking Tylenol, boot helped but still painful  . New Patient (Initial Visit)    48 y.o. female presents with the above complaint.   ROS: Denies fever chills nausea vomiting muscle aches pains calf pain back pain chest pain shortness of breath.  Past Medical History:  Diagnosis Date  . Anemia    history of  . GERD (gastroesophageal reflux disease)   . Hepatitis C antibody test positive   . Inguinal hernia   . Vitamin D deficiency    Past Surgical History:  Procedure Laterality Date  . BACK SURGERY    . HERNIA REPAIR     with mesh  . LAMINECTOMY    . ROBOTIC ASSISTED TOTAL HYSTERECTOMY WITH SALPINGECTOMY Bilateral 02/08/2016   Procedure: ROBOTIC ASSISTED TOTAL HYSTERECTOMY WITH SALPINGECTOMY;  Surgeon: Brien Few, MD;  Location: [facility];  Service: Gynecology;  Laterality: Bilateral;    Current Outpatient Medications:  .  diphenhydrAMINE HCl (ALLERGY MEDICATION PO), Take by mouth., Disp: , Rfl:  .  BIOTIN PO, Take 3,000 mg by mouth daily., Disp: , Rfl:  .  estradiol (ESTRACE) 1 MG tablet, Take 1 mg by mouth daily., Disp: , Rfl:  .  Melatonin 10 MG CAPS, Take 1 capsule by mouth daily as needed (sleep)., Disp: , Rfl:  .  omeprazole (PRILOSEC) 20 MG capsule, Take 20 mg by mouth daily., Disp: , Rfl:   No Known Allergies Review of Systems Objective:   Vitals:   07/19/19 1059  BP: 97/65  Pulse: (!) 56  Resp: 16    General: Well developed, nourished, in no acute distress, alert and oriented x3   Dermatological: Skin is warm, dry and supple bilateral. Nails x 10 are well maintained; remaining integument appears unremarkable at this time. There are no open sores, no  preulcerative lesions, no rash or signs of infection present.  Vascular: Dorsalis Pedis artery and Posterior Tibial artery pedal pulses are 2/4 bilateral with immedate capillary fill time. Pedal hair growth present. No varicosities and no lower extremity edema present bilateral.   Neruologic: Grossly intact via light touch bilateral. Vibratory intact via tuning fork bilateral. Protective threshold with Semmes Wienstein monofilament intact to all pedal sites bilateral. Patellar and Achilles deep tendon reflexes 2+ bilateral. No Babinski or clonus noted bilateral.   Musculoskeletal: No gross boney pedal deformities bilateral. No pain, crepitus, or limitation noted with foot and ankle range of motion bilateral. Muscular strength 5/5 in all groups tested bilateral.  Pain on direct palpation of the second metatarsal neck.  Minimal pain on range of motion of the second metatarsal phalangeal joint.  Gait: Unassisted, Nonantalgic.    Radiographs:  Radiographs demonstrate today a area of sclerosis proximal to the metatarsal head of the second metatarsal which is elongated left.  There is no definitive bone callus but this looks very much like an endosteal stress reaction.  There is some soft tissue swelling around the second metatarsal phalangeal joint.  Assessment & Plan:   Assessment: Stress reaction second metatarsal.   Plan: Discussed etiology pathology and surgical therapies at this point time placed in her [employer] shoe we will follow-up with her in 4 to 6 weeks for another set of x-rays.  Garrel Ridgel, DPM

## 2019-07-21 ENCOUNTER — Ambulatory Visit: Payer: BC Managed Care – PPO | Admitting: Podiatry

## 2019-08-15 ENCOUNTER — Encounter: Payer: Self-pay | Admitting: Podiatry

## 2019-08-15 ENCOUNTER — Other Ambulatory Visit: Payer: Self-pay

## 2019-08-15 ENCOUNTER — Encounter: Payer: Self-pay | Admitting: Family Medicine

## 2019-08-15 DIAGNOSIS — Z20822 Contact with and (suspected) exposure to covid-19: Secondary | ICD-10-CM

## 2019-08-16 ENCOUNTER — Encounter: Payer: Self-pay | Admitting: Podiatry

## 2019-08-16 ENCOUNTER — Other Ambulatory Visit: Payer: Self-pay

## 2019-08-16 ENCOUNTER — Ambulatory Visit (INDEPENDENT_AMBULATORY_CARE_PROVIDER_SITE_OTHER): Payer: BC Managed Care – PPO

## 2019-08-16 ENCOUNTER — Ambulatory Visit (INDEPENDENT_AMBULATORY_CARE_PROVIDER_SITE_OTHER): Payer: BC Managed Care – PPO | Admitting: Podiatry

## 2019-08-16 ENCOUNTER — Encounter: Payer: Self-pay | Admitting: Family Medicine

## 2019-08-16 DIAGNOSIS — M84375D Stress fracture, left foot, subsequent encounter for fracture with routine healing: Secondary | ICD-10-CM | POA: Diagnosis not present

## 2019-08-16 LAB — NOVEL CORONAVIRUS, NAA: SARS-CoV-2, NAA: NOT DETECTED

## 2019-08-16 NOTE — Progress Notes (Signed)
She presents today for follow-up of a stress fracture second metatarsal left foot.  States that it feels like is starting to spread throughout the foot now the outside of the foot is hurting the heel is hurting.  She states that she stopped wearing her boot about a week or so ago she states that she only wore it for maybe 2 to 3 weeks she has been going barefoot and wearing tennis shoes since that time.  Objective: Vital signs are stable alert and oriented x3 there is a mild area of erythema overlying the second metatarsal of the left foot near the anatomical neck.  And radiographs taken today demonstrate metatarsal fracture nondisplaced non-comminuted appears to be a stress fracture with abundant callus.  Assessment: Stress fracture second metatarsal left.  Plan: Encouraged her to get back into her boot and that a lot of this secondary symptomatology should resolve.  Follow-up with her in 1 month for another set of x-rays

## 2019-08-17 ENCOUNTER — Other Ambulatory Visit: Payer: Self-pay | Admitting: Family Medicine

## 2019-08-17 DIAGNOSIS — G4700 Insomnia, unspecified: Secondary | ICD-10-CM

## 2019-08-17 MED ORDER — TRAZODONE HCL 50 MG PO TABS: 25.0000 mg | ORAL_TABLET | Freq: Every evening | ORAL | 0 refills | Status: AC | PRN

## 2019-08-23 ENCOUNTER — Other Ambulatory Visit: Payer: Self-pay

## 2019-08-23 DIAGNOSIS — Z20822 Contact with and (suspected) exposure to covid-19: Secondary | ICD-10-CM

## 2019-08-24 LAB — NOVEL CORONAVIRUS, NAA: SARS-CoV-2, NAA: NOT DETECTED

## 2019-09-13 ENCOUNTER — Ambulatory Visit: Payer: BC Managed Care – PPO | Admitting: Podiatry

## 2019-09-15 ENCOUNTER — Ambulatory Visit: Payer: BC Managed Care – PPO | Admitting: Podiatry

## 2023-01-02 ENCOUNTER — Other Ambulatory Visit: Payer: Self-pay | Admitting: Adult Health Nurse Practitioner

## 2023-01-02 ENCOUNTER — Other Ambulatory Visit: Payer: Self-pay

## 2023-01-02 DIAGNOSIS — Z1231 Encounter for screening mammogram for malignant neoplasm of breast: Secondary | ICD-10-CM

## 2023-02-12 ENCOUNTER — Ambulatory Visit
Admission: RE | Admit: 2023-02-12 | Discharge: 2023-02-12 | Disposition: A | Discharge status: Home / Self Care | Payer: Managed Care, Other (non HMO) | Source: Ambulatory Visit | Attending: Adult Health Nurse Practitioner | Admitting: Adult Health Nurse Practitioner

## 2023-02-12 DIAGNOSIS — Z1231 Encounter for screening mammogram for malignant neoplasm of breast: Secondary | ICD-10-CM

## 2024-01-15 ENCOUNTER — Other Ambulatory Visit: Payer: Self-pay | Admitting: Adult Health Nurse Practitioner

## 2024-01-15 DIAGNOSIS — Z1231 Encounter for screening mammogram for malignant neoplasm of breast: Secondary | ICD-10-CM

## 2024-02-16 ENCOUNTER — Ambulatory Visit
Admission: RE | Admit: 2024-02-16 | Discharge: 2024-02-16 | Disposition: A | Discharge status: Home / Self Care | Source: Ambulatory Visit | Attending: Adult Health Nurse Practitioner | Admitting: Adult Health Nurse Practitioner

## 2024-02-16 DIAGNOSIS — Z1231 Encounter for screening mammogram for malignant neoplasm of breast: Secondary | ICD-10-CM

## 2024-10-25 ENCOUNTER — Institutional Professional Consult (permissible substitution) (INDEPENDENT_AMBULATORY_CARE_PROVIDER_SITE_OTHER): Admitting: Otolaryngology
